# Patient Record
Sex: Female | Born: 1935 | Race: White | Hispanic: No | Smoking: Never smoker
Health system: Southern US, Community
[De-identification: ages and names within clinical notes are randomized; demographics above are authoritative.]

## PROBLEM LIST (undated history)

## (undated) DIAGNOSIS — D649 Anemia, unspecified: Secondary | ICD-10-CM

## (undated) DIAGNOSIS — C55 Malignant neoplasm of uterus, part unspecified: Secondary | ICD-10-CM

## (undated) DIAGNOSIS — H409 Unspecified glaucoma: Secondary | ICD-10-CM

## (undated) DIAGNOSIS — K589 Irritable bowel syndrome without diarrhea: Secondary | ICD-10-CM

## (undated) DIAGNOSIS — D126 Benign neoplasm of colon, unspecified: Secondary | ICD-10-CM

## (undated) DIAGNOSIS — I10 Essential (primary) hypertension: Secondary | ICD-10-CM

## (undated) DIAGNOSIS — C541 Malignant neoplasm of endometrium: Secondary | ICD-10-CM

## (undated) DIAGNOSIS — E785 Hyperlipidemia, unspecified: Secondary | ICD-10-CM

## (undated) DIAGNOSIS — E119 Type 2 diabetes mellitus without complications: Secondary | ICD-10-CM

## (undated) DIAGNOSIS — K5790 Diverticulosis of intestine, part unspecified, without perforation or abscess without bleeding: Secondary | ICD-10-CM

## (undated) DIAGNOSIS — M1990 Unspecified osteoarthritis, unspecified site: Secondary | ICD-10-CM

## (undated) HISTORY — DX: Benign neoplasm of colon, unspecified: D12.6

## (undated) HISTORY — DX: Irritable bowel syndrome, unspecified: K58.9

## (undated) HISTORY — PX: TONSILLECTOMY: SUR1361

## (undated) HISTORY — DX: Hyperlipidemia, unspecified: E78.5

## (undated) HISTORY — DX: Anemia, unspecified: D64.9

## (undated) HISTORY — DX: Unspecified osteoarthritis, unspecified site: M19.90

## (undated) HISTORY — DX: Unspecified glaucoma: H40.9

## (undated) HISTORY — DX: Diverticulosis of intestine, part unspecified, without perforation or abscess without bleeding: K57.90

## (undated) HISTORY — DX: Malignant neoplasm of uterus, part unspecified: C55

## (undated) HISTORY — DX: Essential (primary) hypertension: I10

## (undated) HISTORY — DX: Type 2 diabetes mellitus without complications: E11.9

## (undated) HISTORY — DX: Malignant neoplasm of endometrium: C54.1

---

## 1978-12-06 HISTORY — PX: TUMOR REMOVAL: SHX12

## 2008-12-06 HISTORY — PX: ABDOMINAL HYSTERECTOMY: SHX81

## 2009-05-19 DIAGNOSIS — C541 Malignant neoplasm of endometrium: Secondary | ICD-10-CM

## 2009-05-19 HISTORY — DX: Malignant neoplasm of endometrium: C54.1

## 2011-12-14 DIAGNOSIS — M47817 Spondylosis without myelopathy or radiculopathy, lumbosacral region: Secondary | ICD-10-CM | POA: Diagnosis not present

## 2012-01-11 DIAGNOSIS — M5137 Other intervertebral disc degeneration, lumbosacral region: Secondary | ICD-10-CM | POA: Diagnosis not present

## 2012-01-11 DIAGNOSIS — M47817 Spondylosis without myelopathy or radiculopathy, lumbosacral region: Secondary | ICD-10-CM | POA: Diagnosis not present

## 2012-01-11 DIAGNOSIS — IMO0002 Reserved for concepts with insufficient information to code with codable children: Secondary | ICD-10-CM | POA: Diagnosis not present

## 2012-02-08 DIAGNOSIS — M5137 Other intervertebral disc degeneration, lumbosacral region: Secondary | ICD-10-CM | POA: Diagnosis not present

## 2012-02-08 DIAGNOSIS — M47817 Spondylosis without myelopathy or radiculopathy, lumbosacral region: Secondary | ICD-10-CM | POA: Diagnosis not present

## 2012-02-08 DIAGNOSIS — IMO0002 Reserved for concepts with insufficient information to code with codable children: Secondary | ICD-10-CM | POA: Diagnosis not present

## 2012-03-22 DIAGNOSIS — R0789 Other chest pain: Secondary | ICD-10-CM | POA: Diagnosis not present

## 2012-03-22 DIAGNOSIS — R079 Chest pain, unspecified: Secondary | ICD-10-CM | POA: Diagnosis not present

## 2012-03-22 DIAGNOSIS — K219 Gastro-esophageal reflux disease without esophagitis: Secondary | ICD-10-CM | POA: Diagnosis not present

## 2012-03-22 DIAGNOSIS — I10 Essential (primary) hypertension: Secondary | ICD-10-CM | POA: Diagnosis not present

## 2012-03-22 DIAGNOSIS — K279 Peptic ulcer, site unspecified, unspecified as acute or chronic, without hemorrhage or perforation: Secondary | ICD-10-CM | POA: Diagnosis not present

## 2012-03-22 DIAGNOSIS — E7800 Pure hypercholesterolemia, unspecified: Secondary | ICD-10-CM | POA: Diagnosis not present

## 2012-03-22 DIAGNOSIS — E785 Hyperlipidemia, unspecified: Secondary | ICD-10-CM | POA: Diagnosis not present

## 2012-03-22 DIAGNOSIS — Z90710 Acquired absence of both cervix and uterus: Secondary | ICD-10-CM | POA: Diagnosis not present

## 2012-03-22 DIAGNOSIS — R1013 Epigastric pain: Secondary | ICD-10-CM | POA: Diagnosis not present

## 2012-03-22 DIAGNOSIS — Z794 Long term (current) use of insulin: Secondary | ICD-10-CM | POA: Diagnosis not present

## 2012-03-22 DIAGNOSIS — E119 Type 2 diabetes mellitus without complications: Secondary | ICD-10-CM | POA: Diagnosis not present

## 2012-03-22 DIAGNOSIS — R110 Nausea: Secondary | ICD-10-CM | POA: Diagnosis not present

## 2012-03-22 DIAGNOSIS — R109 Unspecified abdominal pain: Secondary | ICD-10-CM | POA: Diagnosis not present

## 2012-03-27 DIAGNOSIS — M5137 Other intervertebral disc degeneration, lumbosacral region: Secondary | ICD-10-CM | POA: Diagnosis not present

## 2012-03-27 DIAGNOSIS — IMO0002 Reserved for concepts with insufficient information to code with codable children: Secondary | ICD-10-CM | POA: Diagnosis not present

## 2012-03-27 DIAGNOSIS — M999 Biomechanical lesion, unspecified: Secondary | ICD-10-CM | POA: Diagnosis not present

## 2012-03-28 DIAGNOSIS — M999 Biomechanical lesion, unspecified: Secondary | ICD-10-CM | POA: Diagnosis not present

## 2012-03-28 DIAGNOSIS — M5137 Other intervertebral disc degeneration, lumbosacral region: Secondary | ICD-10-CM | POA: Diagnosis not present

## 2012-03-28 DIAGNOSIS — IMO0002 Reserved for concepts with insufficient information to code with codable children: Secondary | ICD-10-CM | POA: Diagnosis not present

## 2012-03-30 DIAGNOSIS — IMO0002 Reserved for concepts with insufficient information to code with codable children: Secondary | ICD-10-CM | POA: Diagnosis not present

## 2012-03-30 DIAGNOSIS — M5137 Other intervertebral disc degeneration, lumbosacral region: Secondary | ICD-10-CM | POA: Diagnosis not present

## 2012-03-30 DIAGNOSIS — M999 Biomechanical lesion, unspecified: Secondary | ICD-10-CM | POA: Diagnosis not present

## 2012-04-03 DIAGNOSIS — M999 Biomechanical lesion, unspecified: Secondary | ICD-10-CM | POA: Diagnosis not present

## 2012-04-03 DIAGNOSIS — IMO0002 Reserved for concepts with insufficient information to code with codable children: Secondary | ICD-10-CM | POA: Diagnosis not present

## 2012-04-03 DIAGNOSIS — M5137 Other intervertebral disc degeneration, lumbosacral region: Secondary | ICD-10-CM | POA: Diagnosis not present

## 2012-04-04 DIAGNOSIS — M5137 Other intervertebral disc degeneration, lumbosacral region: Secondary | ICD-10-CM | POA: Diagnosis not present

## 2012-04-04 DIAGNOSIS — IMO0002 Reserved for concepts with insufficient information to code with codable children: Secondary | ICD-10-CM | POA: Diagnosis not present

## 2012-04-04 DIAGNOSIS — M999 Biomechanical lesion, unspecified: Secondary | ICD-10-CM | POA: Diagnosis not present

## 2012-04-06 DIAGNOSIS — IMO0002 Reserved for concepts with insufficient information to code with codable children: Secondary | ICD-10-CM | POA: Diagnosis not present

## 2012-04-06 DIAGNOSIS — M5137 Other intervertebral disc degeneration, lumbosacral region: Secondary | ICD-10-CM | POA: Diagnosis not present

## 2012-04-06 DIAGNOSIS — M999 Biomechanical lesion, unspecified: Secondary | ICD-10-CM | POA: Diagnosis not present

## 2012-04-10 DIAGNOSIS — M999 Biomechanical lesion, unspecified: Secondary | ICD-10-CM | POA: Diagnosis not present

## 2012-04-10 DIAGNOSIS — IMO0002 Reserved for concepts with insufficient information to code with codable children: Secondary | ICD-10-CM | POA: Diagnosis not present

## 2012-04-10 DIAGNOSIS — M5137 Other intervertebral disc degeneration, lumbosacral region: Secondary | ICD-10-CM | POA: Diagnosis not present

## 2012-04-11 DIAGNOSIS — M999 Biomechanical lesion, unspecified: Secondary | ICD-10-CM | POA: Diagnosis not present

## 2012-04-11 DIAGNOSIS — IMO0002 Reserved for concepts with insufficient information to code with codable children: Secondary | ICD-10-CM | POA: Diagnosis not present

## 2012-04-11 DIAGNOSIS — M5137 Other intervertebral disc degeneration, lumbosacral region: Secondary | ICD-10-CM | POA: Diagnosis not present

## 2012-04-12 DIAGNOSIS — M999 Biomechanical lesion, unspecified: Secondary | ICD-10-CM | POA: Diagnosis not present

## 2012-04-12 DIAGNOSIS — IMO0002 Reserved for concepts with insufficient information to code with codable children: Secondary | ICD-10-CM | POA: Diagnosis not present

## 2012-04-12 DIAGNOSIS — M5137 Other intervertebral disc degeneration, lumbosacral region: Secondary | ICD-10-CM | POA: Diagnosis not present

## 2012-04-25 DIAGNOSIS — M999 Biomechanical lesion, unspecified: Secondary | ICD-10-CM | POA: Diagnosis not present

## 2012-04-25 DIAGNOSIS — IMO0002 Reserved for concepts with insufficient information to code with codable children: Secondary | ICD-10-CM | POA: Diagnosis not present

## 2012-04-25 DIAGNOSIS — M5137 Other intervertebral disc degeneration, lumbosacral region: Secondary | ICD-10-CM | POA: Diagnosis not present

## 2012-04-27 DIAGNOSIS — IMO0002 Reserved for concepts with insufficient information to code with codable children: Secondary | ICD-10-CM | POA: Diagnosis not present

## 2012-04-27 DIAGNOSIS — M5137 Other intervertebral disc degeneration, lumbosacral region: Secondary | ICD-10-CM | POA: Diagnosis not present

## 2012-04-27 DIAGNOSIS — M999 Biomechanical lesion, unspecified: Secondary | ICD-10-CM | POA: Diagnosis not present

## 2012-05-02 DIAGNOSIS — M5137 Other intervertebral disc degeneration, lumbosacral region: Secondary | ICD-10-CM | POA: Diagnosis not present

## 2012-05-02 DIAGNOSIS — IMO0002 Reserved for concepts with insufficient information to code with codable children: Secondary | ICD-10-CM | POA: Diagnosis not present

## 2012-05-02 DIAGNOSIS — M999 Biomechanical lesion, unspecified: Secondary | ICD-10-CM | POA: Diagnosis not present

## 2012-05-03 DIAGNOSIS — IMO0002 Reserved for concepts with insufficient information to code with codable children: Secondary | ICD-10-CM | POA: Diagnosis not present

## 2012-05-03 DIAGNOSIS — M5137 Other intervertebral disc degeneration, lumbosacral region: Secondary | ICD-10-CM | POA: Diagnosis not present

## 2012-05-03 DIAGNOSIS — M999 Biomechanical lesion, unspecified: Secondary | ICD-10-CM | POA: Diagnosis not present

## 2012-05-04 DIAGNOSIS — M999 Biomechanical lesion, unspecified: Secondary | ICD-10-CM | POA: Diagnosis not present

## 2012-05-04 DIAGNOSIS — M5137 Other intervertebral disc degeneration, lumbosacral region: Secondary | ICD-10-CM | POA: Diagnosis not present

## 2012-05-04 DIAGNOSIS — IMO0002 Reserved for concepts with insufficient information to code with codable children: Secondary | ICD-10-CM | POA: Diagnosis not present

## 2012-05-08 DIAGNOSIS — M999 Biomechanical lesion, unspecified: Secondary | ICD-10-CM | POA: Diagnosis not present

## 2012-05-08 DIAGNOSIS — IMO0002 Reserved for concepts with insufficient information to code with codable children: Secondary | ICD-10-CM | POA: Diagnosis not present

## 2012-05-08 DIAGNOSIS — M5137 Other intervertebral disc degeneration, lumbosacral region: Secondary | ICD-10-CM | POA: Diagnosis not present

## 2012-05-10 DIAGNOSIS — M5137 Other intervertebral disc degeneration, lumbosacral region: Secondary | ICD-10-CM | POA: Diagnosis not present

## 2012-05-10 DIAGNOSIS — M999 Biomechanical lesion, unspecified: Secondary | ICD-10-CM | POA: Diagnosis not present

## 2012-05-10 DIAGNOSIS — IMO0002 Reserved for concepts with insufficient information to code with codable children: Secondary | ICD-10-CM | POA: Diagnosis not present

## 2012-05-15 DIAGNOSIS — K5900 Constipation, unspecified: Secondary | ICD-10-CM | POA: Diagnosis not present

## 2012-05-15 DIAGNOSIS — K219 Gastro-esophageal reflux disease without esophagitis: Secondary | ICD-10-CM | POA: Diagnosis not present

## 2012-05-15 DIAGNOSIS — R079 Chest pain, unspecified: Secondary | ICD-10-CM | POA: Diagnosis not present

## 2012-05-16 DIAGNOSIS — M999 Biomechanical lesion, unspecified: Secondary | ICD-10-CM | POA: Diagnosis not present

## 2012-05-16 DIAGNOSIS — M5137 Other intervertebral disc degeneration, lumbosacral region: Secondary | ICD-10-CM | POA: Diagnosis not present

## 2012-05-16 DIAGNOSIS — IMO0002 Reserved for concepts with insufficient information to code with codable children: Secondary | ICD-10-CM | POA: Diagnosis not present

## 2012-05-18 DIAGNOSIS — R079 Chest pain, unspecified: Secondary | ICD-10-CM | POA: Diagnosis not present

## 2012-05-18 DIAGNOSIS — K2970 Gastritis, unspecified, without bleeding: Secondary | ICD-10-CM | POA: Diagnosis not present

## 2012-05-18 DIAGNOSIS — K259 Gastric ulcer, unspecified as acute or chronic, without hemorrhage or perforation: Secondary | ICD-10-CM | POA: Diagnosis not present

## 2012-05-18 DIAGNOSIS — R1013 Epigastric pain: Secondary | ICD-10-CM | POA: Diagnosis not present

## 2012-05-25 DIAGNOSIS — IMO0002 Reserved for concepts with insufficient information to code with codable children: Secondary | ICD-10-CM | POA: Diagnosis not present

## 2012-05-25 DIAGNOSIS — M5137 Other intervertebral disc degeneration, lumbosacral region: Secondary | ICD-10-CM | POA: Diagnosis not present

## 2012-05-25 DIAGNOSIS — M999 Biomechanical lesion, unspecified: Secondary | ICD-10-CM | POA: Diagnosis not present

## 2012-05-30 DIAGNOSIS — M5137 Other intervertebral disc degeneration, lumbosacral region: Secondary | ICD-10-CM | POA: Diagnosis not present

## 2012-05-30 DIAGNOSIS — M999 Biomechanical lesion, unspecified: Secondary | ICD-10-CM | POA: Diagnosis not present

## 2012-05-30 DIAGNOSIS — IMO0002 Reserved for concepts with insufficient information to code with codable children: Secondary | ICD-10-CM | POA: Diagnosis not present

## 2012-06-05 DIAGNOSIS — IMO0002 Reserved for concepts with insufficient information to code with codable children: Secondary | ICD-10-CM | POA: Diagnosis not present

## 2012-06-05 DIAGNOSIS — E782 Mixed hyperlipidemia: Secondary | ICD-10-CM | POA: Diagnosis not present

## 2012-06-05 DIAGNOSIS — I10 Essential (primary) hypertension: Secondary | ICD-10-CM | POA: Diagnosis not present

## 2012-06-05 DIAGNOSIS — E118 Type 2 diabetes mellitus with unspecified complications: Secondary | ICD-10-CM | POA: Diagnosis not present

## 2012-06-05 DIAGNOSIS — R609 Edema, unspecified: Secondary | ICD-10-CM | POA: Diagnosis not present

## 2012-06-06 DIAGNOSIS — I10 Essential (primary) hypertension: Secondary | ICD-10-CM | POA: Diagnosis not present

## 2012-06-06 DIAGNOSIS — G909 Disorder of the autonomic nervous system, unspecified: Secondary | ICD-10-CM | POA: Diagnosis not present

## 2012-06-06 DIAGNOSIS — E782 Mixed hyperlipidemia: Secondary | ICD-10-CM | POA: Diagnosis not present

## 2012-06-06 DIAGNOSIS — E1149 Type 2 diabetes mellitus with other diabetic neurological complication: Secondary | ICD-10-CM | POA: Diagnosis not present

## 2012-06-09 DIAGNOSIS — Z1231 Encounter for screening mammogram for malignant neoplasm of breast: Secondary | ICD-10-CM | POA: Diagnosis not present

## 2012-06-13 DIAGNOSIS — M999 Biomechanical lesion, unspecified: Secondary | ICD-10-CM | POA: Diagnosis not present

## 2012-06-13 DIAGNOSIS — IMO0002 Reserved for concepts with insufficient information to code with codable children: Secondary | ICD-10-CM | POA: Diagnosis not present

## 2012-06-13 DIAGNOSIS — M5137 Other intervertebral disc degeneration, lumbosacral region: Secondary | ICD-10-CM | POA: Diagnosis not present

## 2012-06-20 DIAGNOSIS — IMO0002 Reserved for concepts with insufficient information to code with codable children: Secondary | ICD-10-CM | POA: Diagnosis not present

## 2012-06-20 DIAGNOSIS — M999 Biomechanical lesion, unspecified: Secondary | ICD-10-CM | POA: Diagnosis not present

## 2012-06-20 DIAGNOSIS — M5137 Other intervertebral disc degeneration, lumbosacral region: Secondary | ICD-10-CM | POA: Diagnosis not present

## 2012-12-04 DIAGNOSIS — I10 Essential (primary) hypertension: Secondary | ICD-10-CM | POA: Diagnosis not present

## 2012-12-04 DIAGNOSIS — E782 Mixed hyperlipidemia: Secondary | ICD-10-CM | POA: Diagnosis not present

## 2012-12-04 DIAGNOSIS — E1165 Type 2 diabetes mellitus with hyperglycemia: Secondary | ICD-10-CM | POA: Diagnosis not present

## 2012-12-04 DIAGNOSIS — E118 Type 2 diabetes mellitus with unspecified complications: Secondary | ICD-10-CM | POA: Diagnosis not present

## 2012-12-05 DIAGNOSIS — E1149 Type 2 diabetes mellitus with other diabetic neurological complication: Secondary | ICD-10-CM | POA: Diagnosis not present

## 2012-12-05 DIAGNOSIS — E782 Mixed hyperlipidemia: Secondary | ICD-10-CM | POA: Diagnosis not present

## 2012-12-05 DIAGNOSIS — I10 Essential (primary) hypertension: Secondary | ICD-10-CM | POA: Diagnosis not present

## 2012-12-05 DIAGNOSIS — G909 Disorder of the autonomic nervous system, unspecified: Secondary | ICD-10-CM | POA: Diagnosis not present

## 2012-12-06 DIAGNOSIS — D126 Benign neoplasm of colon, unspecified: Secondary | ICD-10-CM

## 2012-12-06 HISTORY — DX: Benign neoplasm of colon, unspecified: D12.6

## 2012-12-18 DIAGNOSIS — I10 Essential (primary) hypertension: Secondary | ICD-10-CM | POA: Diagnosis not present

## 2012-12-18 DIAGNOSIS — F341 Dysthymic disorder: Secondary | ICD-10-CM | POA: Diagnosis not present

## 2012-12-18 DIAGNOSIS — R300 Dysuria: Secondary | ICD-10-CM | POA: Diagnosis not present

## 2012-12-18 DIAGNOSIS — M545 Low back pain: Secondary | ICD-10-CM | POA: Diagnosis not present

## 2013-01-09 DIAGNOSIS — M5137 Other intervertebral disc degeneration, lumbosacral region: Secondary | ICD-10-CM | POA: Diagnosis not present

## 2013-01-09 DIAGNOSIS — IMO0002 Reserved for concepts with insufficient information to code with codable children: Secondary | ICD-10-CM | POA: Diagnosis not present

## 2013-01-09 DIAGNOSIS — M999 Biomechanical lesion, unspecified: Secondary | ICD-10-CM | POA: Diagnosis not present

## 2013-01-11 DIAGNOSIS — M5137 Other intervertebral disc degeneration, lumbosacral region: Secondary | ICD-10-CM | POA: Diagnosis not present

## 2013-01-11 DIAGNOSIS — M999 Biomechanical lesion, unspecified: Secondary | ICD-10-CM | POA: Diagnosis not present

## 2013-01-11 DIAGNOSIS — IMO0002 Reserved for concepts with insufficient information to code with codable children: Secondary | ICD-10-CM | POA: Diagnosis not present

## 2013-01-15 DIAGNOSIS — R300 Dysuria: Secondary | ICD-10-CM | POA: Diagnosis not present

## 2013-01-15 DIAGNOSIS — R82998 Other abnormal findings in urine: Secondary | ICD-10-CM | POA: Diagnosis not present

## 2013-01-15 DIAGNOSIS — M545 Low back pain: Secondary | ICD-10-CM | POA: Diagnosis not present

## 2013-01-16 DIAGNOSIS — M999 Biomechanical lesion, unspecified: Secondary | ICD-10-CM | POA: Diagnosis not present

## 2013-01-16 DIAGNOSIS — M5137 Other intervertebral disc degeneration, lumbosacral region: Secondary | ICD-10-CM | POA: Diagnosis not present

## 2013-01-16 DIAGNOSIS — IMO0002 Reserved for concepts with insufficient information to code with codable children: Secondary | ICD-10-CM | POA: Diagnosis not present

## 2013-01-22 DIAGNOSIS — IMO0002 Reserved for concepts with insufficient information to code with codable children: Secondary | ICD-10-CM | POA: Diagnosis not present

## 2013-01-22 DIAGNOSIS — M999 Biomechanical lesion, unspecified: Secondary | ICD-10-CM | POA: Diagnosis not present

## 2013-01-22 DIAGNOSIS — M5137 Other intervertebral disc degeneration, lumbosacral region: Secondary | ICD-10-CM | POA: Diagnosis not present

## 2013-02-08 DIAGNOSIS — IMO0002 Reserved for concepts with insufficient information to code with codable children: Secondary | ICD-10-CM | POA: Diagnosis not present

## 2013-03-01 DIAGNOSIS — M5137 Other intervertebral disc degeneration, lumbosacral region: Secondary | ICD-10-CM | POA: Diagnosis not present

## 2013-03-01 DIAGNOSIS — IMO0002 Reserved for concepts with insufficient information to code with codable children: Secondary | ICD-10-CM | POA: Diagnosis not present

## 2013-03-01 DIAGNOSIS — M999 Biomechanical lesion, unspecified: Secondary | ICD-10-CM | POA: Diagnosis not present

## 2013-03-01 DIAGNOSIS — IMO0001 Reserved for inherently not codable concepts without codable children: Secondary | ICD-10-CM | POA: Diagnosis not present

## 2013-03-01 DIAGNOSIS — M62838 Other muscle spasm: Secondary | ICD-10-CM | POA: Diagnosis not present

## 2013-03-06 DIAGNOSIS — M5137 Other intervertebral disc degeneration, lumbosacral region: Secondary | ICD-10-CM | POA: Diagnosis not present

## 2013-03-06 DIAGNOSIS — IMO0002 Reserved for concepts with insufficient information to code with codable children: Secondary | ICD-10-CM | POA: Diagnosis not present

## 2013-03-06 DIAGNOSIS — M999 Biomechanical lesion, unspecified: Secondary | ICD-10-CM | POA: Diagnosis not present

## 2013-03-06 DIAGNOSIS — M62838 Other muscle spasm: Secondary | ICD-10-CM | POA: Diagnosis not present

## 2013-03-06 DIAGNOSIS — IMO0001 Reserved for inherently not codable concepts without codable children: Secondary | ICD-10-CM | POA: Diagnosis not present

## 2013-03-08 DIAGNOSIS — H40019 Open angle with borderline findings, low risk, unspecified eye: Secondary | ICD-10-CM | POA: Diagnosis not present

## 2013-03-08 DIAGNOSIS — H5200 Hypermetropia, unspecified eye: Secondary | ICD-10-CM | POA: Diagnosis not present

## 2013-03-15 DIAGNOSIS — H40019 Open angle with borderline findings, low risk, unspecified eye: Secondary | ICD-10-CM | POA: Diagnosis not present

## 2013-03-26 DIAGNOSIS — H40019 Open angle with borderline findings, low risk, unspecified eye: Secondary | ICD-10-CM | POA: Diagnosis not present

## 2013-03-27 DIAGNOSIS — IMO0001 Reserved for inherently not codable concepts without codable children: Secondary | ICD-10-CM | POA: Diagnosis not present

## 2013-03-27 DIAGNOSIS — IMO0002 Reserved for concepts with insufficient information to code with codable children: Secondary | ICD-10-CM | POA: Diagnosis not present

## 2013-03-27 DIAGNOSIS — M999 Biomechanical lesion, unspecified: Secondary | ICD-10-CM | POA: Diagnosis not present

## 2013-03-27 DIAGNOSIS — M5137 Other intervertebral disc degeneration, lumbosacral region: Secondary | ICD-10-CM | POA: Diagnosis not present

## 2013-03-27 DIAGNOSIS — M62838 Other muscle spasm: Secondary | ICD-10-CM | POA: Diagnosis not present

## 2013-04-20 ENCOUNTER — Telehealth: Payer: Self-pay | Admitting: Family Medicine

## 2013-04-20 ENCOUNTER — Ambulatory Visit (INDEPENDENT_AMBULATORY_CARE_PROVIDER_SITE_OTHER): Payer: Medicare Other | Admitting: Family Medicine

## 2013-04-20 ENCOUNTER — Encounter: Payer: Self-pay | Admitting: Family Medicine

## 2013-04-20 VITALS — BP 140/66 | HR 57 | Temp 98.2°F | Wt 155.0 lb

## 2013-04-20 DIAGNOSIS — I10 Essential (primary) hypertension: Secondary | ICD-10-CM | POA: Diagnosis not present

## 2013-04-20 DIAGNOSIS — R350 Frequency of micturition: Secondary | ICD-10-CM

## 2013-04-20 DIAGNOSIS — E1165 Type 2 diabetes mellitus with hyperglycemia: Secondary | ICD-10-CM | POA: Diagnosis not present

## 2013-04-20 DIAGNOSIS — N390 Urinary tract infection, site not specified: Secondary | ICD-10-CM | POA: Diagnosis not present

## 2013-04-20 DIAGNOSIS — E785 Hyperlipidemia, unspecified: Secondary | ICD-10-CM | POA: Insufficient documentation

## 2013-04-20 DIAGNOSIS — E118 Type 2 diabetes mellitus with unspecified complications: Secondary | ICD-10-CM

## 2013-04-20 MED ORDER — CIPROFLOXACIN HCL 500 MG PO TABS: 500.0000 mg | ORAL_TABLET | Freq: Two times a day (BID) | ORAL | Status: AC

## 2013-04-20 NOTE — Telephone Encounter (Signed)
Med list and name change updated.

## 2013-04-20 NOTE — Telephone Encounter (Signed)
Please correct name: as listed on Medicare card: Janice Norman (not just [place]) , call follows office visit 04/20/13, verify dosage of HCTZ: 50  Mg once daily; Actos (generic) 45 mg once daily; Pantoprazole: 40 mg daily; Simvistatin 20 mg daily; Metformin (generic) 500 mg HS; Enalapril 20 mg daily.

## 2013-04-20 NOTE — Progress Notes (Signed)
  Subjective:    Janice Norman is a 79 y.o. female who complains of frequency and suprapubic pressure. She has had symptoms for 2 weeks. Patient also complains of stomach ache. Patient denies back pain, congestion, cough, fever, headache, rhinitis, sorethroat and vaginal discharge. Patient does not have a history of recurrent UTI. Patient does not have a history of pyelonephritis.   The following portions of the patient's history were reviewed and updated as appropriate: allergies, current medications, past family history, past medical history, past social history, past surgical history and problem list.  Review of Systems Pertinent items are noted in HPI.    Objective:    BP 140/66  Pulse 57  Temp(Src) 98.2 F (36.8 C) (Oral)  Wt 155 lb (70.308 kg)  SpO2 97% General appearance: alert, cooperative, appears stated age and no distress Abdomen: soft, non-tender; bowel sounds normal; no masses,  no organomegaly  Laboratory:  Urine dipstick: pt took AZO.   Micro exam: not done.    Assessment:    dysuria     Plan:    Medications: ciprofloxacin. Maintain adequate hydration. Follow up if symptoms not improving, and as needed.  Culture pending rto to establish

## 2013-04-20 NOTE — Patient Instructions (Addendum)
Urinary Tract Infection Urinary tract infections (UTIs) can develop anywhere along your urinary tract. Your urinary tract is your body's drainage system for removing wastes and extra water. Your urinary tract includes two kidneys, two ureters, a bladder, and a urethra. Your kidneys are a pair of bean-shaped organs. Each kidney is about the size of your fist. They are located below your ribs, one on each side of your spine. CAUSES Infections are caused by microbes, which are microscopic organisms, including fungi, viruses, and bacteria. These organisms are so small that they can only be seen through a microscope. Bacteria are the microbes that most commonly cause UTIs. SYMPTOMS  Symptoms of UTIs may vary by age and gender of the patient and by the location of the infection. Symptoms in young women typically include a frequent and intense urge to urinate and a painful, burning feeling in the bladder or urethra during urination. Older women and men are more likely to be tired, shaky, and weak and have muscle aches and abdominal pain. A fever may mean the infection is in your kidneys. Other symptoms of a kidney infection include pain in your back or sides below the ribs, nausea, and vomiting. DIAGNOSIS To diagnose a UTI, your caregiver will ask you about your symptoms. Your caregiver also will ask to provide a urine sample. The urine sample will be tested for bacteria and white blood cells. White blood cells are made by your body to help fight infection. TREATMENT  Typically, UTIs can be treated with medication. Because most UTIs are caused by a bacterial infection, they usually can be treated with the use of antibiotics. The choice of antibiotic and length of treatment depend on your symptoms and the type of bacteria causing your infection. HOME CARE INSTRUCTIONS  If you were prescribed antibiotics, take them exactly as your caregiver instructs you. Finish the medication even if you feel better after you  have only taken some of the medication.  Drink enough water and fluids to keep your urine clear or pale yellow.  Avoid caffeine, tea, and carbonated beverages. They tend to irritate your bladder.  Empty your bladder often. Avoid holding urine for long periods of time.  Empty your bladder before and after sexual intercourse.  After a bowel movement, women should cleanse from front to back. Use each tissue only once. SEEK MEDICAL CARE IF:   You have back pain.  You develop a fever.  Your symptoms do not begin to resolve within 3 days. SEEK IMMEDIATE MEDICAL CARE IF:   You have severe back pain or lower abdominal pain.  You develop chills.  You have nausea or vomiting.  You have continued burning or discomfort with urination. MAKE SURE YOU:   Understand these instructions.  Will watch your condition.  Will get help right away if you are not doing well or get worse. Document Released: 09/01/2005 Document Revised: 05/23/2012 Document Reviewed: 12/31/2011 ExitCare Patient Information 2013 [employer].  

## 2013-04-23 DIAGNOSIS — H4011X0 Primary open-angle glaucoma, stage unspecified: Secondary | ICD-10-CM | POA: Diagnosis not present

## 2013-04-23 LAB — URINE CULTURE: Colony Count: >=100000

## 2013-05-15 DIAGNOSIS — H25019 Cortical age-related cataract, unspecified eye: Secondary | ICD-10-CM | POA: Diagnosis not present

## 2013-05-15 DIAGNOSIS — H2510 Age-related nuclear cataract, unspecified eye: Secondary | ICD-10-CM | POA: Diagnosis not present

## 2013-05-15 DIAGNOSIS — H25049 Posterior subcapsular polar age-related cataract, unspecified eye: Secondary | ICD-10-CM | POA: Diagnosis not present

## 2013-05-15 DIAGNOSIS — H4010X0 Unspecified open-angle glaucoma, stage unspecified: Secondary | ICD-10-CM | POA: Diagnosis not present

## 2013-05-15 DIAGNOSIS — H3530 Unspecified macular degeneration: Secondary | ICD-10-CM | POA: Diagnosis not present

## 2013-05-15 DIAGNOSIS — E119 Type 2 diabetes mellitus without complications: Secondary | ICD-10-CM | POA: Diagnosis not present

## 2013-05-21 DIAGNOSIS — H4011X0 Primary open-angle glaucoma, stage unspecified: Secondary | ICD-10-CM | POA: Diagnosis not present

## 2013-05-30 DIAGNOSIS — Z124 Encounter for screening for malignant neoplasm of cervix: Secondary | ICD-10-CM | POA: Diagnosis not present

## 2013-05-30 DIAGNOSIS — C549 Malignant neoplasm of corpus uteri, unspecified: Secondary | ICD-10-CM | POA: Diagnosis not present

## 2013-06-04 ENCOUNTER — Ambulatory Visit (INDEPENDENT_AMBULATORY_CARE_PROVIDER_SITE_OTHER): Payer: Medicare Other | Admitting: Family Medicine

## 2013-06-04 ENCOUNTER — Encounter: Payer: Self-pay | Admitting: Family Medicine

## 2013-06-04 VITALS — BP 126/60 | HR 68 | Temp 97.4°F | Ht 62.0 in | Wt 159.4 lb

## 2013-06-04 DIAGNOSIS — IMO0002 Reserved for concepts with insufficient information to code with codable children: Secondary | ICD-10-CM

## 2013-06-04 DIAGNOSIS — E118 Type 2 diabetes mellitus with unspecified complications: Secondary | ICD-10-CM

## 2013-06-04 DIAGNOSIS — E1059 Type 1 diabetes mellitus with other circulatory complications: Secondary | ICD-10-CM

## 2013-06-04 DIAGNOSIS — I10 Essential (primary) hypertension: Secondary | ICD-10-CM

## 2013-06-04 DIAGNOSIS — E785 Hyperlipidemia, unspecified: Secondary | ICD-10-CM

## 2013-06-04 DIAGNOSIS — E1165 Type 2 diabetes mellitus with hyperglycemia: Secondary | ICD-10-CM

## 2013-06-04 DIAGNOSIS — M549 Dorsalgia, unspecified: Secondary | ICD-10-CM

## 2013-06-04 DIAGNOSIS — G4700 Insomnia, unspecified: Secondary | ICD-10-CM

## 2013-06-04 DIAGNOSIS — N390 Urinary tract infection, site not specified: Secondary | ICD-10-CM

## 2013-06-04 DIAGNOSIS — K219 Gastro-esophageal reflux disease without esophagitis: Secondary | ICD-10-CM

## 2013-06-04 DIAGNOSIS — Z23 Encounter for immunization: Secondary | ICD-10-CM

## 2013-06-04 LAB — LIPID PANEL
Cholesterol: 168 mg/dL (ref 0–200)
LDL Cholesterol: 100 mg/dL — ABNORMAL HIGH (ref 0–99)
Total CHOL/HDL Ratio: 4
Triglycerides: 118 mg/dL (ref 0.0–149.0)
VLDL: 23.6 mg/dL (ref 0.0–40.0)

## 2013-06-04 LAB — BASIC METABOLIC PANEL
CO2: 31 mEq/L (ref 19–32)
Calcium: 10 mg/dL (ref 8.4–10.5)
Chloride: 101 mEq/L (ref 96–112)
Creatinine, Ser: 0.8 mg/dL (ref 0.4–1.2)
Sodium: 136 mEq/L (ref 135–145)

## 2013-06-04 LAB — POCT URINALYSIS DIPSTICK
Blood, UA: NEGATIVE
Glucose, UA: NEGATIVE
Nitrite, UA: NEGATIVE
Protein, UA: NEGATIVE
Spec Grav, UA: 1.015
Urobilinogen, UA: 0.2

## 2013-06-04 LAB — HEPATIC FUNCTION PANEL
ALT: 17 U/L (ref 0–35)
AST: 23 U/L (ref 0–37)
Bilirubin, Direct: 0 mg/dL (ref 0.0–0.3)
Total Bilirubin: 0.5 mg/dL (ref 0.3–1.2)

## 2013-06-04 LAB — HEMOGLOBIN A1C: Hgb A1c MFr Bld: 7 % — ABNORMAL HIGH (ref 4.6–6.5)

## 2013-06-04 MED ORDER — PNEUMOCOCCAL VAC POLYVALENT 25 MCG/0.5ML IJ INJ: 0.5000 mL | INJECTION | INTRAMUSCULAR | Status: DC

## 2013-06-04 MED ORDER — METFORMIN HCL ER 500 MG PO TB24: ORAL_TABLET | ORAL | Status: DC

## 2013-06-04 MED ORDER — ZOSTER VACCINE LIVE 19400 UNT/0.65ML ~~LOC~~ SOLR: 0.6500 mL | Freq: Once | SUBCUTANEOUS | Status: DC

## 2013-06-04 MED ORDER — PANTOPRAZOLE SODIUM 40 MG PO TBEC: 40.0000 mg | DELAYED_RELEASE_TABLET | Freq: Every day | ORAL | Status: DC

## 2013-06-04 MED ORDER — HYDROCHLOROTHIAZIDE 50 MG PO TABS: 50.0000 mg | ORAL_TABLET | Freq: Every day | ORAL | Status: DC

## 2013-06-04 MED ORDER — TRAZODONE HCL 50 MG PO TABS: 100.0000 mg | ORAL_TABLET | Freq: Every day | ORAL | Status: DC

## 2013-06-04 NOTE — Patient Instructions (Addendum)
Diabetes and Standards of Medical Care  Diabetes is complicated. You may find that your diabetes team includes a dietitian, nurse, diabetes educator, eye doctor, and more. To help everyone know what is going on and to help you get the care you deserve, the following schedule of care was developed to help keep you on track. Below are the tests, exams, vaccines, medicines, education, and plans you will need. A1c test  Performed at least 2 times a year if you are meeting treatment goals.  Performed 4 times a year if therapy has changed or if you are not meeting treatment goals. Blood pressure test  Performed at every routine medical visit. The goal is less than 120/80 mmHg. Dental exam  Follow up with the dentist regularly. Eye exam  Diagnosed with type 1 diabetes as a child: Get an exam upon reaching the age of 10 years or older and having had diabetes for 3 5 years. Yearly eye exams are recommended after that initial eye exam.  Diagnosed with type 1 diabetes as an adult: Get an exam within 5 years of diagnosis and then yearly.  Diagnosed with type 2 diabetes: Get an exam as soon as possible after the diagnosis and then yearly. Foot care exam  Visual foot exams are performed at every routine medical visit. The exams check for cuts, injuries, or other problems with the feet.  A comprehensive foot exam should be done yearly. This includes visual inspection as well as assessing foot pulses and testing for loss of sensation. Kidney function test (urine microalbumin)  Performed once a year.  Type 1 diabetes: The first test is performed 5 years after diagnosis.  Type 2 diabetes: The first test is performed at the time of diagnosis.  A serum creatinine and estimated glomerular filtration rate (eGFR) test is done once a year to tell the level of chronic kidney disease (CKD), if present. Lipid profile (Cholesterol, HDL, LDL, Triglycerides)  Performed every 5 years for most people.  The  goal for LDL is less than 100 mg/dl. If at high risk, the goal is less than 70 mg/dl.  The goal for HDL is 40 mg/dl 50 mg/dl for men and 50 mg/dl 60 mg/dl for women. An HDL cholesterol of 60 mg/dL or higher gives some protection against heart disease.  The goal for triglycerides is less than 150 mg/dl. Influenza vaccine, pneumococcal vaccine, and hepatitis B vaccine  The influenza vaccine is recommended yearly.  The pneumococcal vaccine is generally given once in a lifetime. However, there are some instances when another vaccination is recommended. Check with your caregiver.  The hepatitis B vaccine is also recommended for adults with diabetes. Diabetes self-management education  Recommended at diagnosis and ongoing as needed. Treatment plan  Reviewed at every medical visit. Document Released: 09/19/2009 Document Revised: 11/08/2012 Document Reviewed: 05/25/2011 ExitCare Patient Information 2014 [employer].  

## 2013-06-04 NOTE — Assessment & Plan Note (Signed)
Stable con't meds 

## 2013-06-04 NOTE — Assessment & Plan Note (Signed)
With weakness in L foot Pt will bring in MRI she had in [place] She was seeing chiropractor May need Neurosurgeon/ or orth

## 2013-06-04 NOTE — Assessment & Plan Note (Signed)
Check labs 

## 2013-06-04 NOTE — Progress Notes (Signed)
  Subjective:    Patient ID: Janice Norman, female    DOB: Jun 28, 1933, 79 y.o.   MRN: [id number]  HPI HYPERTENSION Disease Monitoring Blood pressure range-good  Chest pain- no      Dyspnea- no Medications Compliance- good  Lightheadedness- no   Edema- no   DIABETES Disease Monitoring Blood Sugar ranges-129-175 Polyuria- no New Visual problems- no Medications Compliance- good Hypoglycemic symptoms- no   HYPERLIPIDEMIA Disease Monitoring See symptoms for Hypertension Medications Compliance- good  RUQ pain- no  Muscle aches- no  ROS See HPI above   PMH Smoking Status noted     Review of Systems     Objective:   Physical Exam BP 126/60  Pulse 68  Temp(Src) 97.4 F (36.3 C) (Oral)  Ht 5\' 2"  (1.575 m)  Wt 159 lb 6.4 oz (72.303 kg)  BMI 29.15 kg/m2  SpO2 98% General appearance: alert, cooperative, appears stated age and no distress Neck: no adenopathy, no carotid bruit, no JVD, supple, symmetrical, trachea midline and thyroid not enlarged, symmetric, no tenderness/mass/nodules Lungs: clear to auscultation bilaterally Heart: S1, S2 normal Extremities: extremities normal, atraumatic, no cyanosis or edema Neurologic: weakness in L foot plantar flexion                   DTR = and b/l  Sensory exam of the foot is normal, tested with the monofilament. Good pulses, no lesions or ulcers, good peripheral pulses.       Assessment & Plan:

## 2013-06-04 NOTE — Addendum Note (Signed)
Addended by: Silvio Pate D on: 06/04/2013 02:03 PM   Modules accepted: Orders

## 2013-06-04 NOTE — Assessment & Plan Note (Signed)
Check labs con't meds 

## 2013-06-05 ENCOUNTER — Other Ambulatory Visit: Payer: Self-pay

## 2013-06-05 MED ORDER — SIMVASTATIN 40 MG PO TABS: 40.0000 mg | ORAL_TABLET | Freq: Every evening | ORAL | Status: DC

## 2013-06-07 LAB — URINE CULTURE: Colony Count: 30000

## 2013-06-11 ENCOUNTER — Telehealth: Payer: Self-pay | Admitting: *Deleted

## 2013-06-11 MED ORDER — PIOGLITAZONE HCL 45 MG PO TABS: 45.0000 mg | ORAL_TABLET | Freq: Every day | ORAL | Status: DC

## 2013-06-11 NOTE — Telephone Encounter (Signed)
Pt daughter left VM that Pt is concern about her blood sugar since actos was D/C. Pt daughter wants to know if there will be a med to replace the actos that was D/C. Per labs Diabetes is uncontrolled-- but we increased th Glucophage already Since you have been on the actos I will leave it for now but will consider taking it away later. Pt daughter made aware. Rx sent

## 2013-06-18 ENCOUNTER — Other Ambulatory Visit: Payer: Self-pay | Admitting: Family Medicine

## 2013-06-18 ENCOUNTER — Telehealth: Payer: Self-pay | Admitting: *Deleted

## 2013-06-18 DIAGNOSIS — Z0000 Encounter for general adult medical examination without abnormal findings: Secondary | ICD-10-CM

## 2013-06-18 NOTE — Telephone Encounter (Signed)
Pt daughter left VM that at last OV Dr Laury Axon mention a colonoscopy.Pt daughter would like to know if she needs to set this up or will we do this.Please advise If you would Pt to be referred

## 2013-06-18 NOTE — Telephone Encounter (Signed)
Gi will call them

## 2013-06-19 NOTE — Telephone Encounter (Signed)
Discuss with patient daughter.

## 2013-06-21 ENCOUNTER — Encounter: Payer: Self-pay | Admitting: Internal Medicine

## 2013-06-29 ENCOUNTER — Encounter: Payer: Self-pay | Admitting: Gynecology

## 2013-06-29 ENCOUNTER — Ambulatory Visit: Payer: Medicare Other | Attending: Gynecology | Admitting: Gynecology

## 2013-06-29 VITALS — BP 138/62 | HR 64 | Temp 97.7°F | Resp 18 | Ht 60.95 in | Wt 158.4 lb

## 2013-06-29 DIAGNOSIS — Z9079 Acquired absence of other genital organ(s): Secondary | ICD-10-CM | POA: Insufficient documentation

## 2013-06-29 DIAGNOSIS — Z90710 Acquired absence of both cervix and uterus: Secondary | ICD-10-CM | POA: Insufficient documentation

## 2013-06-29 DIAGNOSIS — Z79899 Other long term (current) drug therapy: Secondary | ICD-10-CM | POA: Diagnosis not present

## 2013-06-29 DIAGNOSIS — C541 Malignant neoplasm of endometrium: Secondary | ICD-10-CM | POA: Insufficient documentation

## 2013-06-29 DIAGNOSIS — I10 Essential (primary) hypertension: Secondary | ICD-10-CM | POA: Diagnosis not present

## 2013-06-29 DIAGNOSIS — C549 Malignant neoplasm of corpus uteri, unspecified: Secondary | ICD-10-CM | POA: Insufficient documentation

## 2013-06-29 NOTE — Progress Notes (Signed)
Consult Note: Gyn-Onc   Janice Norman 79 y.o. female  Chief Complaint  Patient presents with  . Endometrial cancer    Follow up    Assessment : Grade 1 stage IB endometrial cancer 2010. Clinically free of disease. The patient has recently had a Pap smear that was normal.  Plan:She returned to see [place] in one year for surveillance. It is recommended that she have annual mammograms. She is scheduled to have colonoscopy in the near future. :   HPI: 79 year old white widowed female seen in consultation request of Dr. Deirdre Pippins for continued followup of an endometrial cancer. Patient underwent a total abdominal hysterectomy bilateral salpingo-oophorectomy pelvic and para-aortic lymphadenectomy in June 2010 in [place]. She was found to have a tumor that was deeply invasive with lymph vascular space involvement. All lymph nodes were negative. Postoperative hole pelvis radiation therapy was recommended. The patient received 17 fractions of radiation therapy but because of diarrhea discontinued before completing the full course had been recommended. The patient has not had consistent followup since then but apparently has done well.  Review of Systems:10 point review of systems is negative except as noted in interval history.   Vitals: Blood pressure 138/62, pulse 64, temperature 97.7 F (36.5 C), temperature source Oral, resp. rate 18, height 5' 0.95" (1.548 m), weight 158 lb 6.4 oz (71.85 kg).  Physical Exam: General : The patient is a healthy woman in no acute distress.  HEENT: normocephalic, extraoccular movements normal; neck is supple without thyromegally  Lynphnodes: Supraclavicular and inguinal nodes not enlarged  Abdomen: Soft, non-tender, no ascites, no organomegally, no masses, no hernias  Pelvic:  EGBUS: Normal female, there is some erythema of the vulva  Vagina: Normal, no lesions , atrophic Urethra and Bladder: Normal, non-tender  Cervix: Surgically absent   Uterus: Surgically absent  Bi-manual examination: Non-tender; no adenxal masses or nodularity  Rectal: normal sphincter tone, no masses, no blood  Lower extremities: No edema or varicosities. Normal range of motion      No Known Allergies  Past Medical History  Diagnosis Date  . Glaucoma   . Hypertension   . Diabetes mellitus without complication   . Endometrial cancer 05/19/2009    TAHysterectomy  . Uterine cancer     Past Surgical History  Procedure Laterality Date  . Abdominal hysterectomy  2010  . Tumor removal  1980    left ovary    Current Outpatient Prescriptions  Medication Sig Dispense Refill  . enalapril (VASOTEC) 20 MG tablet Take 20 mg by mouth daily.      . hydrochlorothiazide (HYDRODIURIL) 50 MG tablet Take 1 tablet (50 mg total) by mouth daily.  90 tablet  3  . metFORMIN (GLUCOPHAGE XR) 500 MG 24 hr tablet 2 po qpm  60 tablet  2  . pantoprazole (PROTONIX) 40 MG tablet Take 1 tablet (40 mg total) by mouth daily.  90 tablet  3  . pioglitazone (ACTOS) 45 MG tablet Take 1 tablet (45 mg total) by mouth daily.  30 tablet  2  . simvastatin (ZOCOR) 40 MG tablet Take 1 tablet (40 mg total) by mouth every evening.  30 tablet  2  . traZODone (DESYREL) 50 MG tablet Take 2 tablets (100 mg total) by mouth at bedtime.  180 tablet  3  . zoster vaccine live, PF, (ZOSTAVAX) 16109 UNT/0.65ML injection Inject 19,400 Units into the skin once.  1 vial  0   No current facility-administered medications for this visit.  History   Social History  . Marital Status: Widowed    Spouse Name: N/A    Number of Children: N/A  . Years of Education: N/A   Occupational History  . Not on file.   Social History Main Topics  . Smoking status: Never Smoker   . Smokeless tobacco: Not on file  . Alcohol Use: Yes  . Drug Use: No  . Sexually Active: Not on file   Other Topics Concern  . Not on file   Social History Narrative  . No narrative on file    Family History  Problem  Relation Age of Onset  . Hypertension Father   . Diabetes Father   . Diabetes Sister   . Hypertension Sister       Jeannette Corpus, MD 06/29/2013, 3:35 PM

## 2013-06-29 NOTE — Patient Instructions (Signed)
Please be sure to teacher mammogram and colonoscopy as scheduled. We will plan on seeing you again in one year.

## 2013-07-02 ENCOUNTER — Other Ambulatory Visit (HOSPITAL_BASED_OUTPATIENT_CLINIC_OR_DEPARTMENT_OTHER): Payer: Self-pay | Admitting: Obstetrics and Gynecology

## 2013-07-02 DIAGNOSIS — H2510 Age-related nuclear cataract, unspecified eye: Secondary | ICD-10-CM | POA: Diagnosis not present

## 2013-07-02 DIAGNOSIS — Z1231 Encounter for screening mammogram for malignant neoplasm of breast: Secondary | ICD-10-CM

## 2013-07-02 DIAGNOSIS — H269 Unspecified cataract: Secondary | ICD-10-CM | POA: Diagnosis not present

## 2013-07-03 ENCOUNTER — Ambulatory Visit (INDEPENDENT_AMBULATORY_CARE_PROVIDER_SITE_OTHER): Payer: Medicare Other

## 2013-07-03 DIAGNOSIS — Z23 Encounter for immunization: Secondary | ICD-10-CM | POA: Diagnosis not present

## 2013-07-03 DIAGNOSIS — Z2911 Encounter for prophylactic immunotherapy for respiratory syncytial virus (RSV): Secondary | ICD-10-CM

## 2013-07-03 DIAGNOSIS — H2510 Age-related nuclear cataract, unspecified eye: Secondary | ICD-10-CM | POA: Diagnosis not present

## 2013-07-06 ENCOUNTER — Ambulatory Visit (HOSPITAL_BASED_OUTPATIENT_CLINIC_OR_DEPARTMENT_OTHER)
Admission: RE | Admit: 2013-07-06 | Discharge: 2013-07-06 | Disposition: A | Discharge status: Home / Self Care | Payer: Medicare Other | Source: Ambulatory Visit | Attending: Obstetrics and Gynecology | Admitting: Obstetrics and Gynecology

## 2013-07-06 DIAGNOSIS — Z1231 Encounter for screening mammogram for malignant neoplasm of breast: Secondary | ICD-10-CM | POA: Diagnosis not present

## 2013-07-23 DIAGNOSIS — H269 Unspecified cataract: Secondary | ICD-10-CM | POA: Diagnosis not present

## 2013-07-23 DIAGNOSIS — H2510 Age-related nuclear cataract, unspecified eye: Secondary | ICD-10-CM | POA: Diagnosis not present

## 2013-07-26 ENCOUNTER — Encounter: Payer: Self-pay | Admitting: Family Medicine

## 2013-08-08 ENCOUNTER — Ambulatory Visit (AMBULATORY_SURGERY_CENTER): Payer: Self-pay

## 2013-08-08 VITALS — Ht 61.0 in | Wt 157.2 lb

## 2013-08-08 DIAGNOSIS — Z860100 Personal history of colon polyps, unspecified: Secondary | ICD-10-CM

## 2013-08-08 MED ORDER — MOVIPREP 100 G PO SOLR: 1.0000 | Freq: Once | ORAL | Status: DC

## 2013-08-15 ENCOUNTER — Other Ambulatory Visit: Payer: Self-pay

## 2013-08-15 ENCOUNTER — Other Ambulatory Visit: Payer: Self-pay | Admitting: *Deleted

## 2013-08-15 MED ORDER — ENALAPRIL MALEATE 20 MG PO TABS: 20.0000 mg | ORAL_TABLET | Freq: Every day | ORAL | Status: DC

## 2013-08-15 NOTE — Telephone Encounter (Signed)
Rx was refilled for vasotec.  Ag cma

## 2013-08-23 ENCOUNTER — Encounter: Payer: Self-pay | Admitting: Internal Medicine

## 2013-08-23 ENCOUNTER — Ambulatory Visit (AMBULATORY_SURGERY_CENTER): Payer: Medicare Other | Admitting: Internal Medicine

## 2013-08-23 VITALS — BP 144/82 | HR 57 | Temp 97.2°F | Resp 13 | Ht 61.0 in | Wt 157.0 lb

## 2013-08-23 DIAGNOSIS — Z860100 Personal history of colon polyps, unspecified: Secondary | ICD-10-CM

## 2013-08-23 DIAGNOSIS — D126 Benign neoplasm of colon, unspecified: Secondary | ICD-10-CM | POA: Diagnosis not present

## 2013-08-23 DIAGNOSIS — Z1211 Encounter for screening for malignant neoplasm of colon: Secondary | ICD-10-CM | POA: Diagnosis not present

## 2013-08-23 DIAGNOSIS — E119 Type 2 diabetes mellitus without complications: Secondary | ICD-10-CM | POA: Diagnosis not present

## 2013-08-23 DIAGNOSIS — I10 Essential (primary) hypertension: Secondary | ICD-10-CM | POA: Diagnosis not present

## 2013-08-23 MED ORDER — SODIUM CHLORIDE 0.9 % IV SOLN: 500.0000 mL | INTRAVENOUS | Status: DC

## 2013-08-23 NOTE — Patient Instructions (Addendum)

## 2013-08-23 NOTE — Op Note (Signed)
[facility] [address] [place], [address]   COLONOSCOPY PROCEDURE REPORT  PATIENT: Janice Norman, Janice Norman  MR#: [id number] BIRTHDATE: 23-Jul-1933 , 80  yrs. old GENDER: Female ENDOSCOPIST: Roxy Cedar, MD REFERRED BY:.  [facility] PROCEDURE DATE:  08/23/2013 PROCEDURE:   Colonoscopy with snare polypectomy x 2 First Screening Colonoscopy - Avg.  risk and is 50 yrs.  old or older - No.  Prior Negative Screening - Now for repeat screening. N/A  History of Adenoma - Now for follow-up colonoscopy & has been > or = to 3 yrs.  Yes hx of adenoma.  Has been 3 or more years since last colonoscopy.  Polyps Removed Today? Yes. ASA CLASS:   Class II INDICATIONS:Patient's personal history of adenomatous colon polyps. FLA remote. Last [facility] 10-2007 (-) - instructed to f/u 5 yrs MEDICATIONS: MAC sedation, administered by CRNA and propofol (Diprivan) 120mg  IV  DESCRIPTION OF PROCEDURE:   After the risks benefits and alternatives of the procedure were thoroughly explained, informed consent was obtained.  A digital rectal exam revealed no abnormalities of the rectum.   The LB [id number]  endoscope was introduced through the anus and advanced to the cecum, which was identified by both the appendix and ileocecal valve. No adverse events experienced.   The quality of the prep was excellent, using MoviPrep  The instrument was then slowly withdrawn as the colon was fully examined.  COLON FINDINGS: Two diminutive polyps were found at the cecum and in the ascending colon.  A polypectomy was performed with a cold snare.  The resection was complete and the polyp tissue was completely retrieved.   Moderate diverticulosis was noted in the sigmoid colon.   The colon mucosa was otherwise normal. Retroflexed views revealed internal hemorrhoids. The time to cecum=2 minutes 53 seconds.  Withdrawal time=9 minutes 39 seconds. The scope was withdrawn and the procedure  completed. COMPLICATIONS: There were no complications.  ENDOSCOPIC IMPRESSION: 1.   Two diminutive polyps were  in the colon; polypectomy was performed with a cold snare 2.   Moderate diverticulosis was noted in the sigmoid colon 3.   The colon mucosa was otherwise normal  RECOMMENDATIONS: 1. Return to the care of your primary provider.  GI follow up as needed   eSigned:  Roxy Cedar, MD 08/23/2013 9:05 AM   cc: Lelon Perla, DO and The Patient

## 2013-08-23 NOTE — Progress Notes (Signed)
Patient did not experience any of the following events: a burn prior to discharge; a fall within the facility; wrong site/side/patient/procedure/implant event; or a hospital transfer or hospital admission upon discharge from the facility. (G8907) Patient did not have preoperative order for IV antibiotic SSI prophylaxis. (G8918)  

## 2013-08-23 NOTE — Progress Notes (Signed)
Stable to RR 

## 2013-08-23 NOTE — Progress Notes (Signed)
Called to room to assist during endoscopic procedure.  Patient ID and intended procedure confirmed with present staff. Received instructions for my participation in the procedure from the performing physician.  

## 2013-08-24 ENCOUNTER — Telehealth: Payer: Self-pay

## 2013-08-24 NOTE — Telephone Encounter (Signed)
Left message on answering machine. 

## 2013-08-28 ENCOUNTER — Encounter: Payer: Self-pay | Admitting: Internal Medicine

## 2013-09-05 ENCOUNTER — Other Ambulatory Visit: Payer: Self-pay | Admitting: Family Medicine

## 2013-09-17 ENCOUNTER — Other Ambulatory Visit: Payer: Self-pay | Admitting: Family Medicine

## 2013-09-18 ENCOUNTER — Other Ambulatory Visit: Payer: Self-pay | Admitting: Family Medicine

## 2013-10-11 ENCOUNTER — Other Ambulatory Visit: Payer: Self-pay

## 2013-10-24 ENCOUNTER — Telehealth: Payer: Self-pay | Admitting: *Deleted

## 2013-10-24 DIAGNOSIS — G4700 Insomnia, unspecified: Secondary | ICD-10-CM

## 2013-10-24 DIAGNOSIS — R29898 Other symptoms and signs involving the musculoskeletal system: Secondary | ICD-10-CM

## 2013-10-24 NOTE — Telephone Encounter (Addendum)
Patient's daughter called and and stated that she had talked about getting her mother a referral to neurology. Daughter states that patient patient is unable to sleep due to pain in her legs. Would like to go forward with the referral. Please advise. SW

## 2013-10-24 NOTE — Telephone Encounter (Signed)
Ok to refer to neuro for leg pain and foot weakness and insomnia

## 2013-10-25 NOTE — Telephone Encounter (Signed)
Referral placed.

## 2013-10-31 ENCOUNTER — Other Ambulatory Visit: Payer: Self-pay | Admitting: Family Medicine

## 2013-11-23 ENCOUNTER — Other Ambulatory Visit: Payer: Self-pay | Admitting: Family Medicine

## 2013-11-27 ENCOUNTER — Ambulatory Visit (INDEPENDENT_AMBULATORY_CARE_PROVIDER_SITE_OTHER): Payer: Medicare Other

## 2013-11-27 DIAGNOSIS — Z23 Encounter for immunization: Secondary | ICD-10-CM | POA: Diagnosis not present

## 2013-11-27 DIAGNOSIS — H4011X0 Primary open-angle glaucoma, stage unspecified: Secondary | ICD-10-CM | POA: Diagnosis not present

## 2013-12-04 ENCOUNTER — Other Ambulatory Visit: Payer: Self-pay | Admitting: Family Medicine

## 2013-12-17 DIAGNOSIS — R29898 Other symptoms and signs involving the musculoskeletal system: Secondary | ICD-10-CM | POA: Diagnosis not present

## 2013-12-17 DIAGNOSIS — M545 Low back pain, unspecified: Secondary | ICD-10-CM | POA: Diagnosis not present

## 2013-12-28 DIAGNOSIS — M545 Low back pain, unspecified: Secondary | ICD-10-CM | POA: Diagnosis not present

## 2013-12-28 DIAGNOSIS — M5137 Other intervertebral disc degeneration, lumbosacral region: Secondary | ICD-10-CM | POA: Diagnosis not present

## 2014-01-12 ENCOUNTER — Ambulatory Visit (INDEPENDENT_AMBULATORY_CARE_PROVIDER_SITE_OTHER): Payer: Medicare Other | Admitting: Family Medicine

## 2014-01-12 ENCOUNTER — Encounter: Payer: Self-pay | Admitting: Family Medicine

## 2014-01-12 ENCOUNTER — Other Ambulatory Visit: Payer: Self-pay | Admitting: Family Medicine

## 2014-01-12 VITALS — BP 118/60 | HR 74 | Temp 98.8°F | Wt 155.0 lb

## 2014-01-12 DIAGNOSIS — R1031 Right lower quadrant pain: Secondary | ICD-10-CM | POA: Diagnosis not present

## 2014-01-12 DIAGNOSIS — N390 Urinary tract infection, site not specified: Secondary | ICD-10-CM | POA: Diagnosis not present

## 2014-01-12 DIAGNOSIS — R197 Diarrhea, unspecified: Secondary | ICD-10-CM

## 2014-01-12 DIAGNOSIS — R1032 Left lower quadrant pain: Secondary | ICD-10-CM

## 2014-01-12 DIAGNOSIS — R112 Nausea with vomiting, unspecified: Secondary | ICD-10-CM

## 2014-01-12 DIAGNOSIS — K529 Noninfective gastroenteritis and colitis, unspecified: Secondary | ICD-10-CM | POA: Insufficient documentation

## 2014-01-12 DIAGNOSIS — R300 Dysuria: Secondary | ICD-10-CM

## 2014-01-12 LAB — POCT URINALYSIS DIPSTICK
Bilirubin, UA: NEGATIVE
Glucose, UA: NEGATIVE
KETONES UA: NEGATIVE
PROTEIN UA: NEGATIVE
RBC UA: NEGATIVE
SPEC GRAV UA: 1.01
Urobilinogen, UA: NEGATIVE
pH, UA: 6

## 2014-01-12 MED ORDER — CIPROFLOXACIN HCL 250 MG PO TABS: 250.0000 mg | ORAL_TABLET | Freq: Two times a day (BID) | ORAL | Status: DC

## 2014-01-12 NOTE — Progress Notes (Signed)
   Subjective:    Patient ID: Janice Norman, female    DOB: 03-05-1933, 80 y.o.   MRN: [id number]  Emesis  Associated symptoms include abdominal pain, chills and diarrhea. Pertinent negatives include no chest pain, coughing or fever.  Diarrhea  Associated symptoms include abdominal pain, chills and vomiting. Pertinent negatives include no coughing or fever.    80 year old female pt of Dr. Etter Sjogren with history of endometrial ca 2010, DM poorly controlled  on glucophage long time( 06/2013, 7.0), HTN presents with new onset in last 24 hours abdominal pain, vomiting and diarrhea. Abdominal pain periumbilical Yesterday nausea, chills, no fever. Emesis twice yesterday, none today. No blood in emesis. Diarrhea yesterday 5 watery BMs , today 2 BMs more firm but still watery. No blood in BMs.  She has noted dysuria off and on ( in last year, occ uses azo helps for a while)... Now ongoing x 1 week. Urinary frequency.  She has noted every month in the last 7-8 months she has episodes of nausea, abdominal pain, nausea and diarrhea.. Lasts 2-3 days.  She has history of tendency to diarrhea in past.  Cymbalta is new in last 1 month for peripheral neuropathy, Has helped.  Has gallbladder, s/p appendectomy.  Dr. Henrene Pastor, colonoscopy unremarkable 08/2013.      Review of Systems  Constitutional: Positive for chills and fatigue. Negative for fever.  HENT: Negative for ear pain.   Eyes: Negative for pain.  Respiratory: Negative for cough and shortness of breath.   Cardiovascular: Negative for chest pain.  Gastrointestinal: Positive for vomiting, abdominal pain and diarrhea.       Objective:   Physical Exam  Constitutional:  Elderly female in NAD  HENT:  Head: Normocephalic and atraumatic.  Right Ear: External ear normal.  Left Ear: External ear normal.  Nose: Nose normal.  Mouth/Throat: Oropharynx is clear and moist. No oropharyngeal exudate.  Eyes: Conjunctivae are normal. Pupils are  equal, round, and reactive to light. Right eye exhibits no discharge. Left eye exhibits no discharge.  Neck: Normal range of motion. Neck supple. No thyromegaly present.  Cardiovascular: Normal rate and regular rhythm.   No murmur heard. Pulmonary/Chest: Breath sounds normal. No respiratory distress. She has no wheezes. She has no rales. She exhibits no tenderness.  Abdominal: There is no hepatosplenomegaly. There is tenderness in the right lower quadrant, suprapubic area and left lower quadrant. There is no rigidity, no rebound, no guarding and no CVA tenderness. No hernia.            Assessment & Plan:   Very chronic nature of her concerns today.  UA appear suggestive of UTI... Will treat with cipro.   N/V/D and abdominal pain may be related.. But if not improving with UTI treatment would recommend lab eval and possible  abd imaging .  She is nontoxic and with minimal pain today, mainly suprapubic. She will follow up with PCP next week.

## 2014-01-12 NOTE — Progress Notes (Signed)
Pre-visit discussion using our clinic review tool. No additional management support is needed unless otherwise documented below in the visit note.  

## 2014-01-12 NOTE — Patient Instructions (Addendum)
Cipro twice daily x 7 days for UTI. We will send urine for culture.  Schedule follow up with PCP in next week for further eval of chronic issues.   Follow blood sugar closely with infection.

## 2014-01-14 NOTE — Telephone Encounter (Signed)
Med filled.  

## 2014-01-15 LAB — URINE CULTURE: Colony Count: >=100000

## 2014-01-17 ENCOUNTER — Encounter: Payer: Self-pay | Admitting: Family Medicine

## 2014-01-17 ENCOUNTER — Encounter: Payer: Self-pay | Admitting: Internal Medicine

## 2014-01-17 ENCOUNTER — Ambulatory Visit (INDEPENDENT_AMBULATORY_CARE_PROVIDER_SITE_OTHER): Payer: Medicare Other | Admitting: Family Medicine

## 2014-01-17 VITALS — BP 116/60 | HR 62 | Temp 98.3°F | Wt 155.0 lb

## 2014-01-17 DIAGNOSIS — R197 Diarrhea, unspecified: Secondary | ICD-10-CM | POA: Diagnosis not present

## 2014-01-17 DIAGNOSIS — IMO0002 Reserved for concepts with insufficient information to code with codable children: Secondary | ICD-10-CM

## 2014-01-17 DIAGNOSIS — K529 Noninfective gastroenteritis and colitis, unspecified: Secondary | ICD-10-CM

## 2014-01-17 DIAGNOSIS — N390 Urinary tract infection, site not specified: Secondary | ICD-10-CM

## 2014-01-17 DIAGNOSIS — E118 Type 2 diabetes mellitus with unspecified complications: Secondary | ICD-10-CM

## 2014-01-17 DIAGNOSIS — E1165 Type 2 diabetes mellitus with hyperglycemia: Secondary | ICD-10-CM | POA: Diagnosis not present

## 2014-01-17 LAB — POCT URINALYSIS DIPSTICK
BILIRUBIN UA: NEGATIVE
Blood, UA: NEGATIVE
Glucose, UA: NEGATIVE
Ketones, UA: NEGATIVE
Nitrite, UA: POSITIVE
SPEC GRAV UA: 1.01
Urobilinogen, UA: 0.2
pH, UA: 6.5

## 2014-01-17 LAB — BASIC METABOLIC PANEL
BUN: 23 mg/dL (ref 6–23)
CO2: 29 mEq/L (ref 19–32)
Calcium: 9.2 mg/dL (ref 8.4–10.5)
Chloride: 101 mEq/L (ref 96–112)
Creatinine, Ser: 0.8 mg/dL (ref 0.4–1.2)
GFR: 75.83 mL/min (ref >60.00)
Glucose, Bld: 130 mg/dL — ABNORMAL HIGH (ref 70–99)
Potassium: 3.6 mEq/L (ref 3.5–5.1)
Sodium: 138 mEq/L (ref 135–145)

## 2014-01-17 LAB — LIPID PANEL
CHOL/HDL RATIO: 3
Cholesterol: 132 mg/dL (ref 0–200)
HDL: 45 mg/dL (ref >39.00)
LDL CALC: 71 mg/dL (ref 0–99)
Triglycerides: 78 mg/dL (ref 0.0–149.0)
VLDL: 15.6 mg/dL (ref 0.0–40.0)

## 2014-01-17 LAB — HEPATIC FUNCTION PANEL
ALK PHOS: 42 U/L (ref 39–117)
ALT: 13 U/L (ref 0–35)
AST: 19 U/L (ref 0–37)
Albumin: 3.7 g/dL (ref 3.5–5.2)
BILIRUBIN TOTAL: 0.8 mg/dL (ref 0.3–1.2)
Bilirubin, Direct: 0.1 mg/dL (ref 0.0–0.3)
Total Protein: 7.2 g/dL (ref 6.0–8.3)

## 2014-01-17 LAB — HEMOGLOBIN A1C: HEMOGLOBIN A1C: 6.8 % — AB (ref 4.6–6.5)

## 2014-01-17 MED ORDER — PIOGLITAZONE HCL 45 MG PO TABS: ORAL_TABLET | ORAL | Status: DC

## 2014-01-17 MED ORDER — METFORMIN HCL ER 500 MG PO TB24: ORAL_TABLET | ORAL | Status: DC

## 2014-01-17 NOTE — Progress Notes (Signed)
  Subjective:    Janice Norman is a 80 y.o. female who complains of burning with urination, frequency and suprapubic pressure. She has had symptoms for 10 days. Patient also complains of back pain. Patient denies congestion, cough, fever, headache, rhinitis, sorethroat, stomach ache and vaginal discharge. Patient does have a history of recurrent UTI. Patient does not have a history of pyelonephritis.   The following portions of the patient's history were reviewed and updated as appropriate: allergies, current medications, past family history, past medical history, past social history, past surgical history and problem list.  Review of Systems Pertinent items are noted in HPI.    Objective:    BP 116/60  Pulse 62  Temp(Src) 98.3 F (36.8 C) (Oral)  Wt 155 lb (70.308 kg)  SpO2 97% General appearance: alert, cooperative, appears stated age and no distress Abdomen: soft, non-tender; bowel sounds normal; no masses,  no organomegaly  Laboratory:  Urine dipstick: 3+ for leukocyte esterase and positive for nitrites.   Micro exam: not done.    Assessment:    Acute cystitis and UTI     Plan:    Medications: levaquin 500 mg . Maintain adequate hydration. Follow up if symptoms not improving, and as needed. refer to urology   1. UTI (urinary tract infection)  - POCT Urinalysis Dipstick - Urine Culture  2. Chronic diarrhea Refer to GI-- pt with hx IBS and it has been bothering her more regularly - Ambulatory referral to Gastroenterology  3. Type II or unspecified type diabetes mellitus with unspecified complication, uncontrolled con't meds, check labs - Basic metabolic panel - Hemoglobin A1c - Hepatic function panel - Lipid panel  4. Recurrent UTI Change abx to levaquin - Ambulatory referral to Urology

## 2014-01-17 NOTE — Progress Notes (Signed)
Pre visit review using our clinic review tool, if applicable. No additional management support is needed unless otherwise documented below in the visit note. 

## 2014-01-17 NOTE — Patient Instructions (Addendum)
Urinary Tract Infection Urinary tract infections (UTIs) can develop anywhere along your urinary tract. Your urinary tract is your body's drainage system for removing wastes and extra water. Your urinary tract includes two kidneys, two ureters, a bladder, and a urethra. Your kidneys are a pair of bean-shaped organs. Each kidney is about the size of your fist. They are located below your ribs, one on each side of your spine. CAUSES Infections are caused by microbes, which are microscopic organisms, including fungi, viruses, and bacteria. These organisms are so small that they can only be seen through a microscope. Bacteria are the microbes that most commonly cause UTIs. SYMPTOMS  Symptoms of UTIs may vary by age and gender of the patient and by the location of the infection. Symptoms in young women typically include a frequent and intense urge to urinate and a painful, burning feeling in the bladder or urethra during urination. Older women and men are more likely to be tired, shaky, and weak and have muscle aches and abdominal pain. A fever may mean the infection is in your kidneys. Other symptoms of a kidney infection include pain in your back or sides below the ribs, nausea, and vomiting. DIAGNOSIS To diagnose a UTI, your caregiver will ask you about your symptoms. Your caregiver also will ask to provide a urine sample. The urine sample will be tested for bacteria and white blood cells. White blood cells are made by your body to help fight infection. TREATMENT  Typically, UTIs can be treated with medication. Because most UTIs are caused by a bacterial infection, they usually can be treated with the use of antibiotics. The choice of antibiotic and length of treatment depend on your symptoms and the type of bacteria causing your infection. HOME CARE INSTRUCTIONS  If you were prescribed antibiotics, take them exactly as your caregiver instructs you. Finish the medication even if you feel better after you  have only taken some of the medication.  Drink enough water and fluids to keep your urine clear or pale yellow.  Avoid caffeine, tea, and carbonated beverages. They tend to irritate your bladder.  Empty your bladder often. Avoid holding urine for long periods of time.  Empty your bladder before and after sexual intercourse.  After a bowel movement, women should cleanse from front to back. Use each tissue only once. SEEK MEDICAL CARE IF:   You have back pain.  You develop a fever.  Your symptoms do not begin to resolve within 3 days. SEEK IMMEDIATE MEDICAL CARE IF:   You have severe back pain or lower abdominal pain.  You develop chills.  You have nausea or vomiting.  You have continued burning or discomfort with urination. MAKE SURE YOU:   Understand these instructions.  Will watch your condition.  Will get help right away if you are not doing well or get worse. Document Released: 09/01/2005 Document Revised: 05/23/2012 Document Reviewed: 12/31/2011 [facility] Patient Information 2014 [place]. Diabetes and Standards of Medical Care  Diabetes is complicated. You may find that your diabetes team includes a dietitian, nurse, diabetes educator, eye doctor, and more. To help everyone know what is going on and to help you get the care you deserve, the following schedule of care was developed to help keep you on track. Below are the tests, exams, vaccines, medicines, education, and plans you will need. HbA1c test This test shows how well you have controlled your glucose over the past 2 3 months. It is used to see if your diabetes  management plan needs to be adjusted.   It is performed at least 2 times a year if you are meeting treatment goals.  It is performed 4 times a year if therapy has changed or if you are not meeting treatment goals. Blood pressure test  This test is performed at every routine medical visit. The goal is less than 140/90 mmHg for most people, but  130/80 mmHg in some cases. Ask your health care provider about your goal. Dental exam  Follow up with the dentist regularly. Eye exam  If you are diagnosed with type 1 diabetes as a child, get an exam upon reaching the age of 66 years or older and have had diabetes for 3 5 years. Yearly eye exams are recommended after that initial eye exam.  If you are diagnosed with type 1 diabetes as an adult, get an exam within 5 years of diagnosis and then yearly.  If you are diagnosed with type 2 diabetes, get an exam as soon as possible after the diagnosis and then yearly. Foot care exam  Visual foot exams are performed at every routine medical visit. The exams check for cuts, injuries, or other problems with the feet.  A comprehensive foot exam should be done yearly. This includes visual inspection as well as assessing foot pulses and testing for loss of sensation.  Check your feet nightly for cuts, injuries, or other problems with your feet. Tell your health care provider if anything is not healing. Kidney function test (urine microalbumin)  This test is performed once a year.  Type 1 diabetes: The first test is performed 5 years after diagnosis.  Type 2 diabetes: The first test is performed at the time of diagnosis.  A serum creatinine and estimated glomerular filtration rate (eGFR) test is done once a year to assess the level of chronic kidney disease (CKD), if present. Lipid profile (cholesterol, HDL, LDL, triglycerides)  Performed every 5 years for most people.  The goal for LDL is less than 100 mg/dL. If you are at high risk, the goal is less than 70 mg/dL.  The goal for HDL is 40 mg/dL 50 mg/dL for men and 50 mg/dL 60 mg/dL for women. An HDL cholesterol of 60 mg/dL or higher gives some protection against heart disease.  The goal for triglycerides is less than 150 mg/dL. Influenza vaccine, pneumococcal vaccine, and hepatitis B vaccine  The influenza vaccine is recommended  yearly.  The pneumococcal vaccine is generally given once in a lifetime. However, there are some instances when another vaccination is recommended. Check with your health care provider.  The hepatitis B vaccine is also recommended for adults with diabetes. Diabetes self-management education  Education is recommended at diagnosis and ongoing as needed. Treatment plan  Your treatment plan is reviewed at every medical visit. Document Released: 09/19/2009 Document Revised: 07/25/2013 Document Reviewed: 04/24/2013 [facility] Patient Information 2014 [place].

## 2014-01-18 ENCOUNTER — Telehealth: Payer: Self-pay | Admitting: *Deleted

## 2014-01-18 ENCOUNTER — Telehealth: Payer: Self-pay

## 2014-01-18 ENCOUNTER — Encounter: Payer: Self-pay | Admitting: Family Medicine

## 2014-01-18 LAB — URINE CULTURE
COLONY COUNT: NO GROWTH
Organism ID, Bacteria: NO GROWTH

## 2014-01-18 NOTE — Telephone Encounter (Signed)
Relevant patient education assigned to patient using Emmi. ° °

## 2014-01-18 NOTE — Telephone Encounter (Signed)
Patient called and wanted to speak to a nurse regarding her labs because she is going out of town tomorrow and she needs her medication.

## 2014-01-21 ENCOUNTER — Other Ambulatory Visit: Payer: Self-pay | Admitting: Family Medicine

## 2014-01-21 NOTE — Telephone Encounter (Signed)
Unable to reach pt, will try again.

## 2014-01-23 NOTE — Addendum Note (Signed)
Addended by: Carter Kitten on: 01/23/2014 10:25 AM   Modules accepted: Orders

## 2014-01-25 ENCOUNTER — Telehealth: Payer: Self-pay

## 2014-01-25 NOTE — Telephone Encounter (Signed)
MSG left to call the office      KP 

## 2014-01-25 NOTE — Telephone Encounter (Signed)
The pt called and is hoping to speak with the [facility] regarding her medication at her earliest convenience.   Pt callback - (cell) [phone]

## 2014-02-15 ENCOUNTER — Other Ambulatory Visit: Payer: Self-pay | Admitting: Family Medicine

## 2014-02-18 DIAGNOSIS — H409 Unspecified glaucoma: Secondary | ICD-10-CM | POA: Diagnosis not present

## 2014-02-18 DIAGNOSIS — H4011X0 Primary open-angle glaucoma, stage unspecified: Secondary | ICD-10-CM | POA: Diagnosis not present

## 2014-02-20 ENCOUNTER — Other Ambulatory Visit (INDEPENDENT_AMBULATORY_CARE_PROVIDER_SITE_OTHER): Payer: Medicare Other

## 2014-02-20 ENCOUNTER — Telehealth: Payer: Self-pay

## 2014-02-20 ENCOUNTER — Ambulatory Visit (INDEPENDENT_AMBULATORY_CARE_PROVIDER_SITE_OTHER): Payer: Medicare Other | Admitting: Internal Medicine

## 2014-02-20 ENCOUNTER — Encounter: Payer: Self-pay | Admitting: Internal Medicine

## 2014-02-20 VITALS — BP 134/60 | HR 64 | Ht 61.0 in | Wt 158.2 lb

## 2014-02-20 DIAGNOSIS — K589 Irritable bowel syndrome without diarrhea: Secondary | ICD-10-CM

## 2014-02-20 DIAGNOSIS — R112 Nausea with vomiting, unspecified: Secondary | ICD-10-CM

## 2014-02-20 DIAGNOSIS — R197 Diarrhea, unspecified: Secondary | ICD-10-CM

## 2014-02-20 DIAGNOSIS — Z86010 Personal history of colonic polyps: Secondary | ICD-10-CM

## 2014-02-20 LAB — IGA: IgA: 55 mg/dL — ABNORMAL LOW (ref 68–378)

## 2014-02-20 MED ORDER — RIFAXIMIN 550 MG PO TABS: 550.0000 mg | ORAL_TABLET | Freq: Two times a day (BID) | ORAL | Status: DC

## 2014-02-20 NOTE — Patient Instructions (Signed)
Your physician has requested that you go to the basement for the following lab work before leaving today:  TTG, IGA  You have been given some samples of Xifaxan - Take 2 tablets a day.  You have 8 days worth.  I will call you when I can get samples of the remaining 2 days of the course.  You have been given some information on IBS

## 2014-02-20 NOTE — Progress Notes (Signed)
HISTORY OF PRESENT ILLNESS:  Janice Norman is a 80 y.o. female with multiple medical problems as listed below. She presents today, with her daughter, regarding chronic abdominal complaints. She has not been evaluated in this office for these problems. The patient carries a diagnosis of irritable bowel syndrome. She has a history of adenomatous colon polyps with remote colonoscopy in [place] and most recent colonoscopy in [place] November 2008 (negative). Underwent surveillance colonoscopy with me 08/23/2013. She was found to have diminutive colon polyps and moderate sigmoid diverticulosis. Otherwise normal. The polyps were adenomatous. Due to her age, and the minimal findings, future surveillance not recommended. She now reports a many year history of problems with fecal urgency. Worse over the past year. Rare episodes of incontinence. She tells me that she generally has 1 bowel movement per day which is formed. Occasionally 2 bowel movements. However, there is urgency with bowel movements. Bowel movements are generally in the morning. About once per week, she will have loose stools. Symptoms may or may not be exacerbated by meals. Definitely exacerbated by anxiety. She does not wear protective undergarments. No bleeding. Appetite and weight are stable. She also reports occasional episodes of nausea with vomiting about every 6 weeks. This has been going on for years. She denies problems with abdominal pain. Review of outside blood work from last month find hemoglobin A1c of 6.8. Comprehensive metabolic panel normal except for mildly elevated glucose. Question UTI. Has had recurrent problems with UTI. She has been on metformin for some time. Had been using artificial sweeteners, but not recently. Also, had been taking protein shakes, but not recently. No problems with her bowels over the past week or so.  REVIEW OF SYSTEMS:  All non-GI ROS negative except for arthritis, back pain, excessive urination,  urinary frequency, urinary leakage  Past Medical History  Diagnosis Date  . Glaucoma   . Hypertension   . Diabetes mellitus without complication   . Endometrial cancer 05/19/2009    TAHysterectomy  . Uterine cancer   . Adenomatous polyp of colon 2014  . Diverticulosis   . Anemia   . Arthritis   . IBS (irritable bowel syndrome)   . HLD (hyperlipidemia)     Past Surgical History  Procedure Laterality Date  . Abdominal hysterectomy  2010  . Tumor removal Left 1980    left ovary  . Tonsillectomy      Social History Janice Norman  reports that she has never smoked. She has never used smokeless tobacco. She reports that she drinks alcohol. She reports that she does not use illicit drugs.  family history includes Brain cancer in her father; Diabetes in her father and sister; Heart disease in her father; Hypertension in her father and sister; Stomach cancer in her mother. There is no history of Colon cancer.  No Known Allergies     PHYSICAL EXAMINATION: Vital signs: BP 134/60  Pulse 64  Ht 5\' 1"  (1.549 m)  Wt 158 lb 4 oz (71.782 kg)  BMI 29.92 kg/m2  Constitutional: generally well-appearing, no acute distress Psychiatric: alert and oriented x3, cooperative Eyes: extraocular movements intact, anicteric, conjunctiva pink Mouth: oral pharynx moist, no lesions Neck: supple no lymphadenopathy Cardiovascular: heart regular rate and rhythm, no murmur Lungs: clear to auscultation bilaterally Abdomen: soft, nontender, nondistended, no obvious ascites, no peritoneal signs, normal bowel sounds, no organomegaly Rectal: Omitted Extremities: no lower extremity edema bilaterally Skin: no lesions on visible extremities Neuro: No focal deficits.   ASSESSMENT:  #1. Diarrhea  predominant IBS #2. History of adenomatous colon polyps. Surveillance up-to-date #3. Intermittent nausea with vomiting. Chronic. Etiology unclear #4. General medical problems   PLAN:  #1. Tissue  transglutaminase antibody and serum IgA #2. Empiric course of Xifaxan 550 mg twice a day x10 days. Samples given #3. Okay to use Imodium when going out in social situations is concerned about urgent stools #4. Advised with regards to artificial sweeteners, protein shakes, and metformin as potential cause of agents #5. Office followup in 6 weeks

## 2014-02-20 NOTE — Telephone Encounter (Signed)
Left message with patient's daughter that I had received some Xifaxan samples so that patient's 10 day course would be complete (only had 8 days worth in office).   Left message that I would leave them up front for her to pick up.

## 2014-02-21 DIAGNOSIS — N390 Urinary tract infection, site not specified: Secondary | ICD-10-CM | POA: Diagnosis not present

## 2014-02-21 DIAGNOSIS — N952 Postmenopausal atrophic vaginitis: Secondary | ICD-10-CM | POA: Diagnosis not present

## 2014-02-21 LAB — TISSUE TRANSGLUTAMINASE, IGA: Tissue Transglutaminase Ab, IgA: 1 U/mL (ref <20)

## 2014-02-28 DIAGNOSIS — R29898 Other symptoms and signs involving the musculoskeletal system: Secondary | ICD-10-CM | POA: Diagnosis not present

## 2014-02-28 DIAGNOSIS — M545 Low back pain, unspecified: Secondary | ICD-10-CM | POA: Diagnosis not present

## 2014-02-28 DIAGNOSIS — IMO0001 Reserved for inherently not codable concepts without codable children: Secondary | ICD-10-CM | POA: Diagnosis not present

## 2014-03-04 ENCOUNTER — Other Ambulatory Visit: Payer: Self-pay | Admitting: Family Medicine

## 2014-03-06 ENCOUNTER — Other Ambulatory Visit: Payer: Self-pay

## 2014-03-06 MED ORDER — SIMVASTATIN 40 MG PO TABS: ORAL_TABLET | ORAL | Status: DC

## 2014-03-06 NOTE — Telephone Encounter (Signed)
Rx request for Zocor. Labs are current Rx sent for 6 mos    KP

## 2014-03-19 ENCOUNTER — Other Ambulatory Visit: Payer: Self-pay | Admitting: Family Medicine

## 2014-04-05 ENCOUNTER — Telehealth: Payer: Self-pay | Admitting: Family Medicine

## 2014-04-05 NOTE — Telephone Encounter (Signed)
Caller name: Relation to pt: Call back number:[phone] Pharmacy:  Reason for call:  Daughter would like to get pt off of the RX pioglitazone (ACTOS) 45 MG and onto something else.  Daughter requested to speak with Maudie Mercury.

## 2014-04-05 NOTE — Telephone Encounter (Signed)
Janumet is expensive with the patient's plan.     KP

## 2014-04-05 NOTE — Telephone Encounter (Signed)
Spoke with Diane and she would like for her mother to come off of the Actos due to consistent UTI's, Nausea and vomiting on the medication. She said she is concerned bout her developing bladder cancer due to the med's. Mother has had uterine cancer in the past and daughter would rather for her to just be off of Actos. Please advise      KP

## 2014-04-05 NOTE — Telephone Encounter (Signed)
i need to see formulary

## 2014-04-05 NOTE — Telephone Encounter (Signed)
Stop actos---- janumet 100/1000  #30   1 po qd , 2 refills

## 2014-04-08 NOTE — Telephone Encounter (Signed)
Spoke with daughter and she will give the insurance company a call to get a formulary and bring it to the office.     KP

## 2014-04-16 DIAGNOSIS — H16219 Exposure keratoconjunctivitis, unspecified eye: Secondary | ICD-10-CM | POA: Diagnosis not present

## 2014-05-30 DIAGNOSIS — M47817 Spondylosis without myelopathy or radiculopathy, lumbosacral region: Secondary | ICD-10-CM | POA: Diagnosis not present

## 2014-05-30 DIAGNOSIS — M4000 Postural kyphosis, site unspecified: Secondary | ICD-10-CM | POA: Diagnosis not present

## 2014-06-04 DIAGNOSIS — IMO0001 Reserved for inherently not codable concepts without codable children: Secondary | ICD-10-CM | POA: Diagnosis not present

## 2014-06-04 DIAGNOSIS — M545 Low back pain, unspecified: Secondary | ICD-10-CM | POA: Diagnosis not present

## 2014-06-04 DIAGNOSIS — M6281 Muscle weakness (generalized): Secondary | ICD-10-CM | POA: Diagnosis not present

## 2014-06-04 DIAGNOSIS — R262 Difficulty in walking, not elsewhere classified: Secondary | ICD-10-CM | POA: Diagnosis not present

## 2014-06-06 ENCOUNTER — Ambulatory Visit (INDEPENDENT_AMBULATORY_CARE_PROVIDER_SITE_OTHER): Payer: Medicare Other | Admitting: Family Medicine

## 2014-06-06 ENCOUNTER — Encounter: Payer: Self-pay | Admitting: Family Medicine

## 2014-06-06 VITALS — BP 112/68 | HR 81 | Temp 98.2°F | Wt 157.0 lb

## 2014-06-06 DIAGNOSIS — K219 Gastro-esophageal reflux disease without esophagitis: Secondary | ICD-10-CM | POA: Diagnosis not present

## 2014-06-06 DIAGNOSIS — E785 Hyperlipidemia, unspecified: Secondary | ICD-10-CM | POA: Diagnosis not present

## 2014-06-06 DIAGNOSIS — G4700 Insomnia, unspecified: Secondary | ICD-10-CM

## 2014-06-06 DIAGNOSIS — E1159 Type 2 diabetes mellitus with other circulatory complications: Secondary | ICD-10-CM

## 2014-06-06 DIAGNOSIS — I10 Essential (primary) hypertension: Secondary | ICD-10-CM

## 2014-06-06 DIAGNOSIS — Z23 Encounter for immunization: Secondary | ICD-10-CM

## 2014-06-06 DIAGNOSIS — R82998 Other abnormal findings in urine: Secondary | ICD-10-CM

## 2014-06-06 DIAGNOSIS — R8290 Unspecified abnormal findings in urine: Secondary | ICD-10-CM

## 2014-06-06 MED ORDER — TRAZODONE HCL 50 MG PO TABS: 100.0000 mg | ORAL_TABLET | Freq: Every day | ORAL | Status: DC

## 2014-06-06 MED ORDER — PIOGLITAZONE HCL 45 MG PO TABS: ORAL_TABLET | ORAL | Status: DC

## 2014-06-06 MED ORDER — PANTOPRAZOLE SODIUM 40 MG PO TBEC: 40.0000 mg | DELAYED_RELEASE_TABLET | Freq: Every day | ORAL | Status: DC

## 2014-06-06 MED ORDER — ENALAPRIL MALEATE 20 MG PO TABS: ORAL_TABLET | ORAL | Status: DC

## 2014-06-06 MED ORDER — METFORMIN HCL ER 500 MG PO TB24: ORAL_TABLET | ORAL | Status: DC

## 2014-06-06 MED ORDER — HYDROCHLOROTHIAZIDE 50 MG PO TABS: 50.0000 mg | ORAL_TABLET | Freq: Every day | ORAL | Status: DC

## 2014-06-06 MED ORDER — SIMVASTATIN 40 MG PO TABS: ORAL_TABLET | ORAL | Status: DC

## 2014-06-06 NOTE — Addendum Note (Signed)
Addended by: Ewing Schlein on: 06/06/2014 11:49 AM   Modules accepted: Orders

## 2014-06-06 NOTE — Progress Notes (Signed)
Subjective:    Patient ID: Janice Norman, female    DOB: 21-Apr-1933, 80 y.o.   MRN: [id number]  Diabetes Pertinent negatives for hypoglycemia include no dizziness or headaches. Pertinent negatives for diabetes include no chest pain, no fatigue, no polydipsia, no polyphagia and no polyuria.    HPI HYPERTENSION  Blood pressure range-not checking  Chest pain- no      Dyspnea- no Lightheadedness- no   Edema- no Other side effects - no   Medication compliance: good Low salt diet- yes  DIABETES  Blood Sugar ranges-134-217  Polyuria- no New Visual problems- no Hypoglycemic symptoms- no Other side effects-no Medication compliance - good Last eye exam- 04/2014 Foot exam- today  HYPERLIPIDEMIA  Medication compliance- no RUQ pain- no  Muscle aches- no Other side effects-no  Review of Systems  Constitutional: Negative for diaphoresis, appetite change, fatigue and unexpected weight change.  Eyes: Negative for pain, redness and visual disturbance.  Respiratory: Negative for cough, chest tightness, shortness of breath and wheezing.   Cardiovascular: Negative for chest pain, palpitations and leg swelling.  Endocrine: Negative for cold intolerance, heat intolerance, polydipsia, polyphagia and polyuria.  Genitourinary: Negative for dysuria, frequency and difficulty urinating.  Neurological: Negative for dizziness, light-headedness, numbness and headaches.   Past Medical History  Diagnosis Date  . Glaucoma   . Hypertension   . Diabetes mellitus without complication   . Endometrial cancer 05/19/2009    TAHysterectomy  . Uterine cancer   . Adenomatous polyp of Janice 2014  . Diverticulosis   . Anemia   . Arthritis   . IBS (irritable bowel syndrome)   . HLD (hyperlipidemia)    History   Social History  . Marital Status: Widowed    Spouse Name: N/A    Number of Children: 3  . Years of Education: N/A   Occupational History  . retired    Social History Main Topics  .  Smoking status: Never Smoker   . Smokeless tobacco: Never Used  . Alcohol Use: Yes     Comment: wine occasional  . Drug Use: No  . Sexual Activity: Not on file   Other Topics Concern  . Not on file   Social History Narrative  . No narrative on file   Current Outpatient Prescriptions  Medication Sig Dispense Refill  . enalapril (VASOTEC) 20 MG tablet TAKE ONE (1) TABLET EACH DAY  30 tablet  5  . hydrochlorothiazide (HYDRODIURIL) 50 MG tablet Take 1 tablet (50 mg total) by mouth daily.  90 tablet  3  . metFORMIN (GLUCOPHAGE-XR) 500 MG 24 hr tablet TAKE TWO TABLETS BY MOUTH EVERY NIGHT AT BEDTIME  60 tablet  2  . pantoprazole (PROTONIX) 40 MG tablet Take 1 tablet (40 mg total) by mouth daily.  90 tablet  3  . pioglitazone (ACTOS) 45 MG tablet TAKE ONE (1) TABLET EACH DAY  30 tablet  2  . simvastatin (ZOCOR) 40 MG tablet 1 tab by mouth daily  30 tablet  5  . traZODone (DESYREL) 50 MG tablet Take 2 tablets (100 mg total) by mouth at bedtime.  180 tablet  3   No current facility-administered medications for this visit.        Objective:   Physical Exam  Constitutional: She is oriented to person, place, and time. She appears well-developed and well-nourished.  HENT:  Head: Normocephalic and atraumatic.  Eyes: Conjunctivae and EOM are normal.  Neck: Normal range of motion. Neck supple. No JVD present. Carotid bruit  is not present. No thyromegaly present.  Cardiovascular: Normal rate, regular rhythm and normal heart sounds.   No murmur heard. Pulmonary/Chest: Effort normal and breath sounds normal. No respiratory distress. She has no wheezes. She has no rales. She exhibits no tenderness.  Musculoskeletal: She exhibits no edema.  Neurological: She is alert and oriented to person, place, and time.  Psychiatric: She has a normal mood and affect.  Sensory exam of the foot is normal, tested with the monofilament. Good pulses, no lesions or ulcers, good peripheral pulses.           Assessment & Plan:  1. Type II or unspecified type diabetes mellitus with peripheral circulatory disorders, uncontrolled(250.72) Check labs - Basic metabolic panel - Hemoglobin A1c - Hepatic function panel - Lipid panel - POCT urinalysis dipstick - metFORMIN (GLUCOPHAGE-XR) 500 MG 24 hr tablet; TAKE TWO TABLETS BY MOUTH EVERY NIGHT AT BEDTIME  Dispense: 60 tablet; Refill: 2 - pioglitazone (ACTOS) 45 MG tablet; TAKE ONE (1) TABLET EACH DAY  Dispense: 30 tablet; Refill: 2  2. Essential hypertension Stable, con't meds - Basic metabolic panel - hydrochlorothiazide (HYDRODIURIL) 50 MG tablet; Take 1 tablet (50 mg total) by mouth daily.  Dispense: 90 tablet; Refill: 3 - enalapril (VASOTEC) 20 MG tablet; TAKE ONE (1) TABLET EACH DAY  Dispense: 30 tablet; Refill: 5  3. Other and unspecified hyperlipidemia Check labs, con't meds - simvastatin (ZOCOR) 40 MG tablet; 1 tab by mouth daily  Dispense: 30 tablet; Refill: 5  4. Gastroesophageal reflux disease without esophagitis Stable, refill meds - pantoprazole (PROTONIX) 40 MG tablet; Take 1 tablet (40 mg total) by mouth daily.  Dispense: 90 tablet; Refill: 3  5. Insomnia  - traZODone (DESYREL) 50 MG tablet; Take 2 tablets (100 mg total) by mouth at bedtime.  Dispense: 180 tablet; Refill: 3

## 2014-06-06 NOTE — Progress Notes (Signed)
Pre visit review using our clinic review tool, if applicable. No additional management support is needed unless otherwise documented below in the visit note. 

## 2014-06-06 NOTE — Patient Instructions (Signed)
Diabetes and Standards of Medical Care  Diabetes is complicated. You may find that your diabetes team includes a dietitian, nurse, diabetes educator, eye doctor, and more. To help everyone know what is going on and to help you get the care you deserve, the following schedule of care was developed to help keep you on track. Below are the tests, exams, vaccines, medicines, education, and plans you will need. HbA1c test This test shows how well you have controlled your glucose over the past 2-3 months. It is used to see if your diabetes management plan needs to be adjusted.   It is performed at least 2 times a year if you are meeting treatment goals.  It is performed 4 times a year if therapy has changed or if you are not meeting treatment goals. Blood pressure test  This test is performed at every routine medical visit. The goal is less than 140/90 mmHg for most people, but 130/80 mmHg in some cases. Ask your health care provider about your goal. Dental exam  Follow up with the dentist regularly. Eye exam  If you are diagnosed with type 1 diabetes as a child, get an exam upon reaching the age of 10 years or older and have had diabetes for 3-5 years. Yearly eye exams are recommended after that initial eye exam.  If you are diagnosed with type 1 diabetes as an adult, get an exam within 5 years of diagnosis and then yearly.  If you are diagnosed with type 2 diabetes, get an exam as soon as possible after the diagnosis and then yearly. Foot care exam  Visual foot exams are performed at every routine medical visit. The exams check for cuts, injuries, or other problems with the feet.  A comprehensive foot exam should be done yearly. This includes visual inspection as well as assessing foot pulses and testing for loss of sensation.  Check your feet nightly for cuts, injuries, or other problems with your feet. Tell your health care provider if anything is not healing. Kidney function test (urine  microalbumin)  This test is performed once a year.  Type 1 diabetes: The first test is performed 5 years after diagnosis.  Type 2 diabetes: The first test is performed at the time of diagnosis.  A serum creatinine and estimated glomerular filtration rate (eGFR) test is done once a year to assess the level of chronic kidney disease (CKD), if present. Lipid profile (cholesterol, HDL, LDL, triglycerides)  Performed every 5 years for most people.  The goal for LDL is less than 100 mg/dL. If you are at high risk, the goal is less than 70 mg/dL.  The goal for HDL is 40 mg/dL-50 mg/dL for men and 50 mg/dL-60 mg/dL for women. An HDL cholesterol of 60 mg/dL or higher gives some protection against heart disease.  The goal for triglycerides is less than 150 mg/dL. Influenza vaccine, pneumococcal vaccine, and hepatitis B vaccine  The influenza vaccine is recommended yearly.  The pneumococcal vaccine is generally given once in a lifetime. However, there are some instances when another vaccination is recommended. Check with your health care provider.  The hepatitis B vaccine is also recommended for adults with diabetes. Diabetes self-management education  Education is recommended at diagnosis and ongoing as needed. Treatment plan  Your treatment plan is reviewed at every medical visit. Document Released: 09/19/2009 Document Revised: 07/25/2013 Document Reviewed: 04/24/2013 [employer] Patient Information 2015 [employer]. This information is not intended to replace advice given to you by your   health care provider. Make sure you discuss any questions you have with your health care provider.

## 2014-06-10 ENCOUNTER — Telehealth: Payer: Self-pay | Admitting: Family Medicine

## 2014-06-10 DIAGNOSIS — IMO0001 Reserved for inherently not codable concepts without codable children: Secondary | ICD-10-CM | POA: Diagnosis not present

## 2014-06-10 DIAGNOSIS — M545 Low back pain, unspecified: Secondary | ICD-10-CM | POA: Diagnosis not present

## 2014-06-10 DIAGNOSIS — M6281 Muscle weakness (generalized): Secondary | ICD-10-CM | POA: Diagnosis not present

## 2014-06-10 DIAGNOSIS — R262 Difficulty in walking, not elsewhere classified: Secondary | ICD-10-CM | POA: Diagnosis not present

## 2014-06-10 NOTE — Telephone Encounter (Signed)
Relevant patient education assigned to patient using Emmi. ° °

## 2014-06-11 ENCOUNTER — Other Ambulatory Visit: Payer: Medicare Other

## 2014-06-11 DIAGNOSIS — R82998 Other abnormal findings in urine: Secondary | ICD-10-CM | POA: Diagnosis not present

## 2014-06-11 LAB — LIPID PANEL
CHOLESTEROL: 154 mg/dL (ref 0–200)
HDL: 41 mg/dL (ref >39.00)
LDL Cholesterol: 90 mg/dL (ref 0–99)
NonHDL: 113
TRIGLYCERIDES: 116 mg/dL (ref 0.0–149.0)
Total CHOL/HDL Ratio: 4
VLDL: 23.2 mg/dL (ref 0.0–40.0)

## 2014-06-11 LAB — HEPATIC FUNCTION PANEL
ALT: 12 U/L (ref 0–35)
AST: 20 U/L (ref 0–37)
Albumin: 3.9 g/dL (ref 3.5–5.2)
Alkaline Phosphatase: 45 U/L (ref 39–117)
Bilirubin, Direct: 0.1 mg/dL (ref 0.0–0.3)
Total Bilirubin: 0.6 mg/dL (ref 0.2–1.2)
Total Protein: 7.2 g/dL (ref 6.0–8.3)

## 2014-06-11 LAB — BASIC METABOLIC PANEL
BUN: 26 mg/dL — ABNORMAL HIGH (ref 6–23)
CALCIUM: 9.6 mg/dL (ref 8.4–10.5)
CO2: 32 mEq/L (ref 19–32)
CREATININE: 0.7 mg/dL (ref 0.4–1.2)
Chloride: 103 mEq/L (ref 96–112)
GFR: 84.44 mL/min (ref >60.00)
Glucose, Bld: 130 mg/dL — ABNORMAL HIGH (ref 70–99)
Potassium: 4 mEq/L (ref 3.5–5.1)
Sodium: 139 mEq/L (ref 135–145)

## 2014-06-11 LAB — POCT URINALYSIS DIPSTICK
Bilirubin, UA: NEGATIVE
Blood, UA: NEGATIVE
Glucose, UA: NEGATIVE
KETONES UA: NEGATIVE
Nitrite, UA: NEGATIVE
Protein, UA: NEGATIVE
Spec Grav, UA: <=1.005
Urobilinogen, UA: 0.2
pH, UA: 7.5

## 2014-06-11 LAB — HEMOGLOBIN A1C: HEMOGLOBIN A1C: 6.6 % — AB (ref 4.6–6.5)

## 2014-06-11 NOTE — Addendum Note (Signed)
Addended by: Modena Morrow D on: 06/11/2014 04:29 PM   Modules accepted: Orders

## 2014-06-12 NOTE — Progress Notes (Signed)
Urine culture currently pending.

## 2014-06-13 DIAGNOSIS — R262 Difficulty in walking, not elsewhere classified: Secondary | ICD-10-CM | POA: Diagnosis not present

## 2014-06-13 DIAGNOSIS — M545 Low back pain, unspecified: Secondary | ICD-10-CM | POA: Diagnosis not present

## 2014-06-13 DIAGNOSIS — IMO0001 Reserved for inherently not codable concepts without codable children: Secondary | ICD-10-CM | POA: Diagnosis not present

## 2014-06-13 DIAGNOSIS — M6281 Muscle weakness (generalized): Secondary | ICD-10-CM | POA: Diagnosis not present

## 2014-06-13 LAB — URINE CULTURE: Colony Count: 60000

## 2014-06-17 DIAGNOSIS — IMO0001 Reserved for inherently not codable concepts without codable children: Secondary | ICD-10-CM | POA: Diagnosis not present

## 2014-06-17 DIAGNOSIS — M6281 Muscle weakness (generalized): Secondary | ICD-10-CM | POA: Diagnosis not present

## 2014-06-17 DIAGNOSIS — R262 Difficulty in walking, not elsewhere classified: Secondary | ICD-10-CM | POA: Diagnosis not present

## 2014-06-17 DIAGNOSIS — M545 Low back pain, unspecified: Secondary | ICD-10-CM | POA: Diagnosis not present

## 2014-06-19 DIAGNOSIS — M6281 Muscle weakness (generalized): Secondary | ICD-10-CM | POA: Diagnosis not present

## 2014-06-19 DIAGNOSIS — M545 Low back pain, unspecified: Secondary | ICD-10-CM | POA: Diagnosis not present

## 2014-06-19 DIAGNOSIS — IMO0001 Reserved for inherently not codable concepts without codable children: Secondary | ICD-10-CM | POA: Diagnosis not present

## 2014-06-19 DIAGNOSIS — R262 Difficulty in walking, not elsewhere classified: Secondary | ICD-10-CM | POA: Diagnosis not present

## 2014-06-28 ENCOUNTER — Other Ambulatory Visit: Payer: Self-pay | Admitting: Family Medicine

## 2014-07-01 DIAGNOSIS — M545 Low back pain, unspecified: Secondary | ICD-10-CM | POA: Diagnosis not present

## 2014-07-01 DIAGNOSIS — R262 Difficulty in walking, not elsewhere classified: Secondary | ICD-10-CM | POA: Diagnosis not present

## 2014-07-01 DIAGNOSIS — IMO0001 Reserved for inherently not codable concepts without codable children: Secondary | ICD-10-CM | POA: Diagnosis not present

## 2014-07-01 DIAGNOSIS — M6281 Muscle weakness (generalized): Secondary | ICD-10-CM | POA: Diagnosis not present

## 2014-07-03 DIAGNOSIS — M545 Low back pain, unspecified: Secondary | ICD-10-CM | POA: Diagnosis not present

## 2014-07-03 DIAGNOSIS — M6281 Muscle weakness (generalized): Secondary | ICD-10-CM | POA: Diagnosis not present

## 2014-07-03 DIAGNOSIS — R262 Difficulty in walking, not elsewhere classified: Secondary | ICD-10-CM | POA: Diagnosis not present

## 2014-07-03 DIAGNOSIS — IMO0001 Reserved for inherently not codable concepts without codable children: Secondary | ICD-10-CM | POA: Diagnosis not present

## 2014-07-08 DIAGNOSIS — M545 Low back pain, unspecified: Secondary | ICD-10-CM | POA: Diagnosis not present

## 2014-07-08 DIAGNOSIS — R262 Difficulty in walking, not elsewhere classified: Secondary | ICD-10-CM | POA: Diagnosis not present

## 2014-07-08 DIAGNOSIS — M6281 Muscle weakness (generalized): Secondary | ICD-10-CM | POA: Diagnosis not present

## 2014-07-08 DIAGNOSIS — IMO0001 Reserved for inherently not codable concepts without codable children: Secondary | ICD-10-CM | POA: Diagnosis not present

## 2014-07-11 DIAGNOSIS — R262 Difficulty in walking, not elsewhere classified: Secondary | ICD-10-CM | POA: Diagnosis not present

## 2014-07-11 DIAGNOSIS — M545 Low back pain, unspecified: Secondary | ICD-10-CM | POA: Diagnosis not present

## 2014-07-11 DIAGNOSIS — IMO0001 Reserved for inherently not codable concepts without codable children: Secondary | ICD-10-CM | POA: Diagnosis not present

## 2014-07-11 DIAGNOSIS — M6281 Muscle weakness (generalized): Secondary | ICD-10-CM | POA: Diagnosis not present

## 2014-08-06 DIAGNOSIS — M545 Low back pain, unspecified: Secondary | ICD-10-CM | POA: Diagnosis not present

## 2014-08-06 DIAGNOSIS — M6281 Muscle weakness (generalized): Secondary | ICD-10-CM | POA: Diagnosis not present

## 2014-08-06 DIAGNOSIS — IMO0001 Reserved for inherently not codable concepts without codable children: Secondary | ICD-10-CM | POA: Diagnosis not present

## 2014-08-06 DIAGNOSIS — R262 Difficulty in walking, not elsewhere classified: Secondary | ICD-10-CM | POA: Diagnosis not present

## 2014-09-23 ENCOUNTER — Other Ambulatory Visit: Payer: Self-pay | Admitting: Family Medicine

## 2014-10-07 ENCOUNTER — Other Ambulatory Visit: Payer: Self-pay | Admitting: Dermatology

## 2014-10-07 DIAGNOSIS — L942 Calcinosis cutis: Secondary | ICD-10-CM | POA: Diagnosis not present

## 2014-10-21 ENCOUNTER — Other Ambulatory Visit: Payer: Self-pay | Admitting: Family Medicine

## 2014-11-22 ENCOUNTER — Other Ambulatory Visit: Payer: Self-pay | Admitting: Dermatology

## 2014-11-22 DIAGNOSIS — L942 Calcinosis cutis: Secondary | ICD-10-CM | POA: Diagnosis not present

## 2014-12-03 DIAGNOSIS — Z6827 Body mass index (BMI) 27.0-27.9, adult: Secondary | ICD-10-CM | POA: Diagnosis not present

## 2014-12-03 DIAGNOSIS — M545 Low back pain: Secondary | ICD-10-CM | POA: Diagnosis not present

## 2014-12-03 DIAGNOSIS — M5431 Sciatica, right side: Secondary | ICD-10-CM | POA: Diagnosis not present

## 2014-12-10 ENCOUNTER — Ambulatory Visit: Payer: Medicare Other | Admitting: Family Medicine

## 2014-12-19 DIAGNOSIS — M5431 Sciatica, right side: Secondary | ICD-10-CM | POA: Diagnosis not present

## 2014-12-19 DIAGNOSIS — Z6827 Body mass index (BMI) 27.0-27.9, adult: Secondary | ICD-10-CM | POA: Diagnosis not present

## 2014-12-19 DIAGNOSIS — M545 Low back pain: Secondary | ICD-10-CM | POA: Diagnosis not present

## 2014-12-20 ENCOUNTER — Other Ambulatory Visit: Payer: Self-pay | Admitting: Orthopedic Surgery

## 2014-12-20 DIAGNOSIS — M545 Low back pain: Secondary | ICD-10-CM

## 2014-12-24 ENCOUNTER — Ambulatory Visit (INDEPENDENT_AMBULATORY_CARE_PROVIDER_SITE_OTHER): Payer: Medicare Other | Admitting: Family Medicine

## 2014-12-24 ENCOUNTER — Encounter: Payer: Self-pay | Admitting: Family Medicine

## 2014-12-24 VITALS — BP 160/70 | HR 68 | Temp 97.8°F | Wt 150.8 lb

## 2014-12-24 DIAGNOSIS — IMO0002 Reserved for concepts with insufficient information to code with codable children: Secondary | ICD-10-CM

## 2014-12-24 DIAGNOSIS — K219 Gastro-esophageal reflux disease without esophagitis: Secondary | ICD-10-CM | POA: Diagnosis not present

## 2014-12-24 DIAGNOSIS — I10 Essential (primary) hypertension: Secondary | ICD-10-CM | POA: Diagnosis not present

## 2014-12-24 DIAGNOSIS — E1165 Type 2 diabetes mellitus with hyperglycemia: Secondary | ICD-10-CM

## 2014-12-24 DIAGNOSIS — M159 Polyosteoarthritis, unspecified: Secondary | ICD-10-CM | POA: Diagnosis not present

## 2014-12-24 DIAGNOSIS — R8290 Unspecified abnormal findings in urine: Secondary | ICD-10-CM | POA: Diagnosis not present

## 2014-12-24 LAB — POCT URINALYSIS DIPSTICK
Bilirubin, UA: NEGATIVE
Blood, UA: NEGATIVE
GLUCOSE UA: NEGATIVE
Ketones, UA: NEGATIVE
Nitrite, UA: NEGATIVE
PROTEIN UA: NEGATIVE
Spec Grav, UA: >=1.03
Urobilinogen, UA: NEGATIVE
pH, UA: 6.5

## 2014-12-24 LAB — BASIC METABOLIC PANEL
BUN: 27 mg/dL — AB (ref 6–23)
CO2: 29 mEq/L (ref 19–32)
Calcium: 10.2 mg/dL (ref 8.4–10.5)
Chloride: 104 mEq/L (ref 96–112)
Creatinine, Ser: 0.84 mg/dL (ref 0.40–1.20)
GFR: 69.45 mL/min (ref >60.00)
Glucose, Bld: 148 mg/dL — ABNORMAL HIGH (ref 70–99)
POTASSIUM: 3.8 meq/L (ref 3.5–5.1)
Sodium: 139 mEq/L (ref 135–145)

## 2014-12-24 LAB — HEPATIC FUNCTION PANEL
ALBUMIN: 4.3 g/dL (ref 3.5–5.2)
ALT: 12 U/L (ref 0–35)
AST: 16 U/L (ref 0–37)
Alkaline Phosphatase: 67 U/L (ref 39–117)
BILIRUBIN DIRECT: 0.1 mg/dL (ref 0.0–0.3)
TOTAL PROTEIN: 8.1 g/dL (ref 6.0–8.3)
Total Bilirubin: 0.5 mg/dL (ref 0.2–1.2)

## 2014-12-24 LAB — LIPID PANEL
CHOLESTEROL: 135 mg/dL (ref 0–200)
HDL: 41.6 mg/dL (ref >39.00)
LDL Cholesterol: 61 mg/dL (ref 0–99)
NONHDL: 93.4
TRIGLYCERIDES: 161 mg/dL — AB (ref 0.0–149.0)
Total CHOL/HDL Ratio: 3
VLDL: 32.2 mg/dL (ref 0.0–40.0)

## 2014-12-24 LAB — MICROALBUMIN / CREATININE URINE RATIO
CREATININE, U: 83.8 mg/dL
MICROALB/CREAT RATIO: 8.1 mg/g (ref 0.0–30.0)
Microalb, Ur: 6.8 mg/dL — ABNORMAL HIGH (ref 0.0–1.9)

## 2014-12-24 LAB — HEMOGLOBIN A1C: Hgb A1c MFr Bld: 7.3 % — ABNORMAL HIGH (ref 4.6–6.5)

## 2014-12-24 MED ORDER — METFORMIN HCL ER 500 MG PO TB24: ORAL_TABLET | ORAL | Status: DC

## 2014-12-24 MED ORDER — PANTOPRAZOLE SODIUM 40 MG PO TBEC: DELAYED_RELEASE_TABLET | ORAL | Status: DC

## 2014-12-24 MED ORDER — PIOGLITAZONE HCL 45 MG PO TABS: ORAL_TABLET | ORAL | Status: DC

## 2014-12-24 MED ORDER — ENALAPRIL MALEATE 20 MG PO TABS: ORAL_TABLET | ORAL | Status: DC

## 2014-12-24 MED ORDER — HYDROCODONE-ACETAMINOPHEN 10-325 MG PO TABS: 1.0000 | ORAL_TABLET | Freq: Three times a day (TID) | ORAL | Status: DC

## 2014-12-24 NOTE — Patient Instructions (Addendum)
Diabetes and Standards of Medical Care Diabetes is complicated. You may find that your diabetes team includes a dietitian, nurse, diabetes educator, eye doctor, and more. To help everyone know what is going on and to help you get the care you deserve, the following schedule of care was developed to help keep you on track. Below are the tests, exams, vaccines, medicines, education, and plans you will need. HbA1c test This test shows how well you have controlled your glucose over the past 2-3 months. It is used to see if your diabetes management plan needs to be adjusted.   It is performed at least 2 times a year if you are meeting treatment goals.  It is performed 4 times a year if therapy has changed or if you are not meeting treatment goals. Blood pressure test  This test is performed at every routine medical visit. The goal is less than 140/90 mm Hg for most people, but 130/80 mm Hg in some cases. Ask your health care provider about your goal. Dental exam  Follow up with the dentist regularly. Eye exam  If you are diagnosed with type 1 diabetes as a child, get an exam upon reaching the age of 37 years or older and have had diabetes for 3-5 years. Yearly eye exams are recommended after that initial eye exam.  If you are diagnosed with type 1 diabetes as an adult, get an exam within 5 years of diagnosis and then yearly.  If you are diagnosed with type 2 diabetes, get an exam as soon as possible after the diagnosis and then yearly. Foot care exam  Visual foot exams are performed at every routine medical visit. The exams check for cuts, injuries, or other problems with the feet.  A comprehensive foot exam should be done yearly. This includes visual inspection as well as assessing foot pulses and testing for loss of sensation.  Check your feet nightly for cuts, injuries, or other problems with your feet. Tell your health care provider if anything is not healing. Kidney function test (urine  microalbumin)  This test is performed once a year.  Type 1 diabetes: The first test is performed 5 years after diagnosis.  Type 2 diabetes: The first test is performed at the time of diagnosis.  A serum creatinine and estimated glomerular filtration rate (eGFR) test is done once a year to assess the level of chronic kidney disease (CKD), if present. Lipid profile (cholesterol, HDL, LDL, triglycerides)  Performed every 5 years for most people.  The goal for LDL is less than 100 mg/dL. If you are at high risk, the goal is less than 70 mg/dL.  The goal for HDL is 40 mg/dL-50 mg/dL for men and 50 mg/dL-60 mg/dL for women. An HDL cholesterol of 60 mg/dL or higher gives some protection against heart disease.  The goal for triglycerides is less than 150 mg/dL. Influenza vaccine, pneumococcal vaccine, and hepatitis B vaccine  The influenza vaccine is recommended yearly.  It is recommended that people with diabetes who are over 24 years old get the pneumonia vaccine. In some cases, two separate shots may be given. Ask your health care provider if your pneumonia vaccination is up to date.  The hepatitis B vaccine is also recommended for adults with diabetes. Diabetes self-management education  Education is recommended at diagnosis and ongoing as needed. Treatment plan  Your treatment plan is reviewed at every medical visit. Document Released: 09/19/2009 Document Revised: 04/08/2014 Document Reviewed: 04/24/2013 [facility] Patient Information 2015 [place],  [employer]. This information is not intended to replace advice given to you by your health care provider. Make sure you discuss any questions you have with your health care provider.  

## 2014-12-24 NOTE — Progress Notes (Signed)
   Subjective:    Patient ID: Janice Norman, female    DOB: Nov 03, 1933, 81 y.o.   MRN: [id number]  HPI  Pt is here for f/u and informs [place] that her daughter passed away at [facility].she is having a hard time with it.  HYPERTENSION  Blood pressure range-does not check   Chest pain- no      Dyspnea- no Lightheadedness- no   Edema- no Other side effects - no   Medication compliance: good Low salt diet- yes  DIABETES  Blood Sugar ranges-130-160  Polyuria- no New Visual problems- no Hypoglycemic symptoms- no Other side effects-no Medication compliance - good Last eye exam- annual Foot exam- today       Review of Systems    as above Objective:   Physical Exam BP 160/70 mmHg  Pulse 68  Temp(Src) 97.8 F (36.6 C) (Oral)  Wt 150 lb 12.8 oz (68.402 kg)  SpO2 95% General appearance: alert, cooperative, appears stated age and no distress Eyes: negative findings: lids and lashes normal and conjunctivae and sclerae normal Ears: normal TM's and external ear canals both ears Nose: Nares normal. Septum midline. Mucosa normal. No drainage or sinus tenderness. Throat: lips, mucosa, and tongue normal; teeth and gums normal Neck: no adenopathy, no carotid bruit, no JVD, supple, symmetrical, trachea midline and thyroid not enlarged, symmetric, no tenderness/mass/nodules Lungs: clear to auscultation bilaterally Heart: regular rate and rhythm, S1, S2 normal, no murmur, click, rub or gallop Extremities: extremities normal, atraumatic, no cyanosis or edema  Sensory exam of the foot is normal, tested with the monofilament. Good pulses, no lesions or ulcers, good peripheral pulses.        Assessment & Plan:  1. Diabetes mellitus type II, uncontrolled Check labs  - Basic metabolic panel - Hemoglobin A1c - Hepatic function panel - Lipid panel - Microalbumin / creatinine urine ratio - POCT urinalysis dipstick - pioglitazone (ACTOS) 45 MG tablet; TAKE ONE (1) TABLET BY MOUTH EVERY  DAY  Dispense: 30 tablet; Refill: 5 - metFORMIN (GLUCOPHAGE-XR) 500 MG 24 hr tablet; TAKE TWO TABLETS BY MOUTH EVERY NIGHT AT BEDTIME  Dispense: 60 tablet; Refill: 2  2. Essential hypertension Slightly high today,  Pt is under a lot of stress --- she went to wrong office at [facility] and her daughter died at [facility]--- liver/ kidney failure - Basic metabolic panel - Hemoglobin A1c - Hepatic function panel - Lipid panel - Microalbumin / creatinine urine ratio - POCT urinalysis dipstick - enalapril (VASOTEC) 20 MG tablet; TAKE ONE (1) TABLET EACH DAY  Dispense: 30 tablet; Refill: 5  3. Gastroesophageal reflux disease without esophagitis  - pantoprazole (PROTONIX) 40 MG tablet; TAKE ONE (1) TABLET EACH DAY  Dispense: 90 tablet; Refill: 1  4. Generalized OA  - HYDROcodone-acetaminophen (NORCO) 10-325 MG per tablet; Take 1 tablet by mouth 3 (three) times daily.  Dispense: 30 tablet; Refill: 0  5. Abnormal urine  - Urine Culture

## 2014-12-24 NOTE — Progress Notes (Signed)
Pre visit review using our clinic review tool, if applicable. No additional management support is needed unless otherwise documented below in the visit note. 

## 2014-12-27 LAB — URINE CULTURE
Colony Count: NO GROWTH
ORGANISM ID, BACTERIA: NO GROWTH

## 2014-12-31 ENCOUNTER — Ambulatory Visit
Admission: RE | Admit: 2014-12-31 | Discharge: 2014-12-31 | Disposition: A | Discharge status: Home / Self Care | Payer: Medicare Other | Source: Ambulatory Visit | Attending: Orthopedic Surgery | Admitting: Orthopedic Surgery

## 2014-12-31 DIAGNOSIS — M5126 Other intervertebral disc displacement, lumbar region: Secondary | ICD-10-CM | POA: Diagnosis not present

## 2014-12-31 DIAGNOSIS — M5136 Other intervertebral disc degeneration, lumbar region: Secondary | ICD-10-CM | POA: Diagnosis not present

## 2014-12-31 DIAGNOSIS — M47816 Spondylosis without myelopathy or radiculopathy, lumbar region: Secondary | ICD-10-CM | POA: Diagnosis not present

## 2014-12-31 DIAGNOSIS — M545 Low back pain: Secondary | ICD-10-CM

## 2014-12-31 DIAGNOSIS — M4856XA Collapsed vertebra, not elsewhere classified, lumbar region, initial encounter for fracture: Secondary | ICD-10-CM | POA: Diagnosis not present

## 2015-01-09 DIAGNOSIS — M8008XA Age-related osteoporosis with current pathological fracture, vertebra(e), initial encounter for fracture: Secondary | ICD-10-CM | POA: Diagnosis not present

## 2015-01-09 DIAGNOSIS — M5431 Sciatica, right side: Secondary | ICD-10-CM | POA: Diagnosis not present

## 2015-01-09 DIAGNOSIS — M545 Low back pain: Secondary | ICD-10-CM | POA: Diagnosis not present

## 2015-01-13 ENCOUNTER — Other Ambulatory Visit: Payer: Self-pay | Admitting: Family Medicine

## 2015-01-13 NOTE — Telephone Encounter (Signed)
Last filled:  12/24/14 Amt: 31, 2  Too soon to refill.

## 2015-01-17 DIAGNOSIS — M8008XD Age-related osteoporosis with current pathological fracture, vertebra(e), subsequent encounter for fracture with routine healing: Secondary | ICD-10-CM | POA: Diagnosis not present

## 2015-01-30 DIAGNOSIS — M545 Low back pain: Secondary | ICD-10-CM | POA: Diagnosis not present

## 2015-01-30 DIAGNOSIS — M5431 Sciatica, right side: Secondary | ICD-10-CM | POA: Diagnosis not present

## 2015-01-30 DIAGNOSIS — M8008XD Age-related osteoporosis with current pathological fracture, vertebra(e), subsequent encounter for fracture with routine healing: Secondary | ICD-10-CM | POA: Diagnosis not present

## 2015-01-30 DIAGNOSIS — Z6827 Body mass index (BMI) 27.0-27.9, adult: Secondary | ICD-10-CM | POA: Diagnosis not present

## 2015-03-06 DIAGNOSIS — M8008XD Age-related osteoporosis with current pathological fracture, vertebra(e), subsequent encounter for fracture with routine healing: Secondary | ICD-10-CM | POA: Diagnosis not present

## 2015-03-06 DIAGNOSIS — M5431 Sciatica, right side: Secondary | ICD-10-CM | POA: Diagnosis not present

## 2015-03-06 DIAGNOSIS — M545 Low back pain: Secondary | ICD-10-CM | POA: Diagnosis not present

## 2015-03-11 ENCOUNTER — Ambulatory Visit: Payer: Medicare Other | Admitting: Family Medicine

## 2015-04-07 ENCOUNTER — Telehealth: Payer: Self-pay | Admitting: Family Medicine

## 2015-04-07 NOTE — Telephone Encounter (Signed)
Patient left voice message stating that she needs to reschedule 04/21/15 appointment. Called and left message for patient to call us back and talk to someone to reschedule

## 2015-04-10 ENCOUNTER — Telehealth: Payer: Self-pay | Admitting: Family Medicine

## 2015-04-10 DIAGNOSIS — E785 Hyperlipidemia, unspecified: Secondary | ICD-10-CM

## 2015-04-10 MED ORDER — SIMVASTATIN 40 MG PO TABS: ORAL_TABLET | ORAL | Status: DC

## 2015-04-10 NOTE — Telephone Encounter (Signed)
Relation to pt: self Call back number: [phone] Pharmacy: [facility] [address], [place], [place] [address] [phone] (new pharmacy)     Reason for call: Pt requesting a refill simvastatin (ZOCOR) 40 MG tablet

## 2015-04-10 NOTE — Telephone Encounter (Signed)
Rx faxed.    KP 

## 2015-04-11 ENCOUNTER — Telehealth: Payer: Self-pay | Admitting: Family Medicine

## 2015-04-11 DIAGNOSIS — E1165 Type 2 diabetes mellitus with hyperglycemia: Secondary | ICD-10-CM

## 2015-04-11 DIAGNOSIS — IMO0002 Reserved for concepts with insufficient information to code with codable children: Secondary | ICD-10-CM

## 2015-04-11 MED ORDER — METFORMIN HCL ER 500 MG PO TB24: ORAL_TABLET | ORAL | Status: DC

## 2015-04-11 NOTE — Telephone Encounter (Signed)
Pt switched pharmacy requesting RX please send to  [id number] - [place], [address] [phone] (Phone)

## 2015-04-11 NOTE — Telephone Encounter (Signed)
Relation to pt: self  Call back [facility], [place] - [address] [phone] (Phone) [phone] (Fax)         Reason for call:  Pt requesting a refill metFORMIN (GLUCOPHAGE-XR) 500 MG 24 hr tablet

## 2015-04-11 NOTE — Telephone Encounter (Signed)
Rx faxed.    KP 

## 2015-04-11 NOTE — Addendum Note (Signed)
Addended by: Ewing Schlein on: 04/11/2015 04:47 PM   Modules accepted: Orders

## 2015-04-21 ENCOUNTER — Ambulatory Visit: Payer: PRIVATE HEALTH INSURANCE | Admitting: Family Medicine

## 2015-04-24 DIAGNOSIS — M545 Low back pain: Secondary | ICD-10-CM | POA: Diagnosis not present

## 2015-04-24 DIAGNOSIS — Z6827 Body mass index (BMI) 27.0-27.9, adult: Secondary | ICD-10-CM | POA: Diagnosis not present

## 2015-04-24 DIAGNOSIS — M8008XD Age-related osteoporosis with current pathological fracture, vertebra(e), subsequent encounter for fracture with routine healing: Secondary | ICD-10-CM | POA: Diagnosis not present

## 2015-04-24 DIAGNOSIS — M5431 Sciatica, right side: Secondary | ICD-10-CM | POA: Diagnosis not present

## 2015-05-02 ENCOUNTER — Ambulatory Visit (INDEPENDENT_AMBULATORY_CARE_PROVIDER_SITE_OTHER): Payer: Medicare Other | Admitting: Family Medicine

## 2015-05-02 ENCOUNTER — Encounter: Payer: Self-pay | Admitting: Family Medicine

## 2015-05-02 VITALS — BP 126/80 | HR 74 | Temp 97.7°F | Resp 18 | Ht 62.0 in | Wt 131.0 lb

## 2015-05-02 DIAGNOSIS — IMO0002 Reserved for concepts with insufficient information to code with codable children: Secondary | ICD-10-CM

## 2015-05-02 DIAGNOSIS — E1165 Type 2 diabetes mellitus with hyperglycemia: Secondary | ICD-10-CM

## 2015-05-02 DIAGNOSIS — E785 Hyperlipidemia, unspecified: Secondary | ICD-10-CM

## 2015-05-02 DIAGNOSIS — F329 Major depressive disorder, single episode, unspecified: Secondary | ICD-10-CM

## 2015-05-02 DIAGNOSIS — K219 Gastro-esophageal reflux disease without esophagitis: Secondary | ICD-10-CM

## 2015-05-02 DIAGNOSIS — F32A Depression, unspecified: Secondary | ICD-10-CM

## 2015-05-02 DIAGNOSIS — I10 Essential (primary) hypertension: Secondary | ICD-10-CM | POA: Diagnosis not present

## 2015-05-02 DIAGNOSIS — E119 Type 2 diabetes mellitus without complications: Secondary | ICD-10-CM | POA: Diagnosis not present

## 2015-05-02 LAB — HEPATIC FUNCTION PANEL
ALK PHOS: 54 U/L (ref 39–117)
ALT: 12 U/L (ref 0–35)
AST: 22 U/L (ref 0–37)
Albumin: 4.1 g/dL (ref 3.5–5.2)
BILIRUBIN DIRECT: 0.1 mg/dL (ref 0.0–0.3)
BILIRUBIN INDIRECT: 0.3 mg/dL (ref 0.2–1.2)
BILIRUBIN TOTAL: 0.4 mg/dL (ref 0.2–1.2)
Total Protein: 7.4 g/dL (ref 6.0–8.3)

## 2015-05-02 LAB — BASIC METABOLIC PANEL
BUN: 30 mg/dL — ABNORMAL HIGH (ref 6–23)
CO2: 26 mEq/L (ref 19–32)
Calcium: 9.4 mg/dL (ref 8.4–10.5)
Chloride: 101 mEq/L (ref 96–112)
Creat: 0.82 mg/dL (ref 0.50–1.10)
Glucose, Bld: 97 mg/dL (ref 70–99)
Potassium: 4 mEq/L (ref 3.5–5.3)
Sodium: 137 mEq/L (ref 135–145)

## 2015-05-02 LAB — LIPID PANEL
Cholesterol: 158 mg/dL (ref 0–200)
HDL: 39 mg/dL — ABNORMAL LOW (ref >=46)
LDL Cholesterol: 85 mg/dL (ref 0–99)
Total CHOL/HDL Ratio: 4.1 Ratio
Triglycerides: 169 mg/dL — ABNORMAL HIGH (ref <150)
VLDL: 34 mg/dL (ref 0–40)

## 2015-05-02 MED ORDER — ENALAPRIL MALEATE 20 MG PO TABS: ORAL_TABLET | ORAL | Status: DC

## 2015-05-02 MED ORDER — HYDROCHLOROTHIAZIDE 50 MG PO TABS: 50.0000 mg | ORAL_TABLET | Freq: Every day | ORAL | Status: DC

## 2015-05-02 MED ORDER — PIOGLITAZONE HCL 45 MG PO TABS: ORAL_TABLET | ORAL | Status: DC

## 2015-05-02 MED ORDER — PANTOPRAZOLE SODIUM 40 MG PO TBEC: DELAYED_RELEASE_TABLET | ORAL | Status: DC

## 2015-05-02 MED ORDER — SIMVASTATIN 40 MG PO TABS: ORAL_TABLET | ORAL | Status: DC

## 2015-05-02 MED ORDER — METFORMIN HCL ER 500 MG PO TB24: ORAL_TABLET | ORAL | Status: DC

## 2015-05-02 NOTE — Patient Instructions (Signed)

## 2015-05-02 NOTE — Progress Notes (Signed)
Pre visit review using our clinic review tool, if applicable. No additional management support is needed unless otherwise documented below in the visit note. 

## 2015-05-03 LAB — HEMOGLOBIN A1C
Hgb A1c MFr Bld: 6.5 % — ABNORMAL HIGH (ref <5.7)
MEAN PLASMA GLUCOSE: 140 mg/dL — AB (ref <117)

## 2015-05-05 DIAGNOSIS — F32A Depression, unspecified: Secondary | ICD-10-CM | POA: Insufficient documentation

## 2015-05-05 DIAGNOSIS — F329 Major depressive disorder, single episode, unspecified: Secondary | ICD-10-CM | POA: Insufficient documentation

## 2015-05-05 NOTE — Assessment & Plan Note (Signed)
Stable con't cymbalta 

## 2015-05-05 NOTE — Progress Notes (Signed)
Patient ID: Janice Norman, female    DOB: 01-23-1933  Age: 81 y.o. MRN: [id number]    Subjective:  Subjective HPI Janice Norman presents for f/u htn, bp and dm and depression / anxiety.    Review of Systems  Constitutional: Negative for diaphoresis, activity change, appetite change, fatigue and unexpected weight change.  Eyes: Negative for pain, redness and visual disturbance.  Respiratory: Negative for cough, chest tightness, shortness of breath and wheezing.   Cardiovascular: Negative for chest pain, palpitations and leg swelling.  Endocrine: Negative for cold intolerance, heat intolerance, polydipsia, polyphagia and polyuria.  Genitourinary: Negative for dysuria, frequency and difficulty urinating.  Neurological: Negative for dizziness, light-headedness, numbness and headaches.  Psychiatric/Behavioral: Negative for suicidal ideas, behavioral problems, sleep disturbance, dysphoric mood and agitation. The patient is not nervous/anxious.     History Past Medical History  Diagnosis Date  . Glaucoma   . Hypertension   . Diabetes mellitus without complication   . Endometrial cancer 05/19/2009    TAHysterectomy  . Uterine cancer   . Adenomatous polyp of colon 2014  . Diverticulosis   . Anemia   . Arthritis   . IBS (irritable bowel syndrome)   . HLD (hyperlipidemia)     She has past surgical history that includes Abdominal hysterectomy (2010); Tumor removal (Left, 1980); and Tonsillectomy.   Her family history includes Brain cancer in her father; Diabetes in her father and sister; Heart disease in her father; Hypertension in her father and sister; Stomach cancer in her mother. There is no history of Colon cancer.She reports that she has never smoked. She has never used smokeless tobacco. She reports that she drinks alcohol. She reports that she does not use illicit drugs.  Current Outpatient Prescriptions on File Prior to Visit  Medication Sig Dispense Refill  . DULoxetine  (CYMBALTA) 30 MG capsule Take 1 capsule by mouth at bedtime.    . gabapentin (NEURONTIN) 300 MG capsule Take 1 capsule by mouth at bedtime.    Marland Kitchen HYDROcodone-acetaminophen (NORCO) 10-325 MG per tablet Take 1 tablet by mouth 3 (three) times daily. 30 tablet 0  . meloxicam (MOBIC) 15 MG tablet Take 1 tablet by mouth daily.    . TRAVATAN Z 0.004 % SOLN ophthalmic solution Place 1 drop into both eyes at bedtime.     No current facility-administered medications on file prior to visit.     Objective:  Objective Physical Exam  Constitutional: She is oriented to person, place, and time. She appears well-developed and well-nourished.  HENT:  Head: Normocephalic and atraumatic.  Eyes: Conjunctivae and EOM are normal.  Neck: Normal range of motion. Neck supple. No JVD present. Carotid bruit is not present. No thyromegaly present.  Cardiovascular: Normal rate, regular rhythm and normal heart sounds.   No murmur heard. Pulmonary/Chest: Effort normal and breath sounds normal. No respiratory distress. She has no wheezes. She has no rales. She exhibits no tenderness.  Musculoskeletal: She exhibits no edema.  Neurological: She is alert and oriented to person, place, and time.  Psychiatric: She has a normal mood and affect. Her behavior is normal. Judgment and thought content normal.   BP 126/80 mmHg  Pulse 74  Temp(Src) 97.7 F (36.5 C) (Oral)  Resp 18  Ht 5\' 2"  (1.575 m)  Wt 131 lb (59.421 kg)  BMI 23.95 kg/m2  SpO2 98% Wt Readings from Last 3 Encounters:  05/02/15 131 lb (59.421 kg)  12/24/14 150 lb 12.8 oz (68.402 kg)  06/06/14 157 lb (71.215  kg)     Lab Results  Component Value Date   GLUCOSE 97 05/02/2015   CHOL 158 05/02/2015   TRIG 169* 05/02/2015   HDL 39* 05/02/2015   LDLCALC 85 05/02/2015   ALT 12 05/02/2015   AST 22 05/02/2015   NA 137 05/02/2015   K 4.0 05/02/2015   CL 101 05/02/2015   CREATININE 0.82 05/02/2015   BUN 30* 05/02/2015   CO2 26 05/02/2015   HGBA1C 6.5*  05/02/2015   MICROALBUR 6.8* 12/24/2014    Mr Lumbar Spine Wo Contrast  12/31/2014   CLINICAL DATA:  Low back pain for 2 years, worsening since December of 2015. Bilateral leg weakness. Sciatica.  EXAM: MRI LUMBAR SPINE WITHOUT CONTRAST  TECHNIQUE: Multiplanar, multisequence MR imaging of the lumbar spine was performed. No intravenous contrast was administered.  COMPARISON:  None.  FINDINGS: The lowest lumbar type non-rib-bearing vertebra is labeled as L5. The conus medullaris appears normal. Conus level: L1.  Disc desiccation noted at all levels between L1 and L5.  Acute or subacute 40% compression fracture of the L5 vertebra with extensive vertebral body edema tracking back into the pedicles.  Prominent posterior paraspinal muscular atrophy.  Additional findings at individual levels are as follows:  L1-2: Mild displacement of the right L1 nerve in the lateral extraforaminal space due to disc bulge.  L2-3: No impingement. Diffuse disc bulge with left foraminal and lateral extraforaminal disc protrusion. Bilateral facet arthropathy, left greater than right.  L3-4: Mild bilateral subarticular lateral recess stenosis with borderline bilateral foraminal stenosis due to facet arthropathy and diffuse disc bulge.  L4-5: Moderate left and mild right subarticular lateral recess stenosis with mild central narrowing of the thecal sac and borderline bilateral foraminal stenosis due to disc bulge, facet arthropathy, and a focal cephalad extending extradural process likely representing disc extrusion on image 4 of series 5, less likely to be a small epidural collection of blood products related to the L5 compression fracture.  L5-S1: Borderline bilateral foraminal stenosis due to disc bulge and facet arthropathy.  IMPRESSION: 1. Acute or subacute 40% superior compression fracture at L5, most likely benign. 2. Lumbar spondylosis and degenerative disc disease cause moderate impingement at L4-5 and mild impingement at L1- 2  and L3-4, as detailed above. Of note, there is a disc extrusion or small epidural collection of blood products extending cephalad from the L4-5 level in the left paracentral position, contributing to the subarticular lateral recess stenosis at L4-5. 3. Prominent posterior paraspinal muscular atrophy throughout the lumbar spine.   Electronically Signed   By: Sherryl Barters M.D.   On: 12/31/2014 15:33     Assessment & Plan:  Plan I am having Ms. Rollen Sox maintain her gabapentin, meloxicam, TRAVATAN Z, DULoxetine, HYDROcodone-acetaminophen, traMADol, enalapril, hydrochlorothiazide, metFORMIN, pantoprazole, pioglitazone, and simvastatin.  Meds ordered this encounter  Medications  . traMADol (ULTRAM) 50 MG tablet    Sig:     Refill:  2  . enalapril (VASOTEC) 20 MG tablet    Sig: TAKE ONE (1) TABLET EACH DAY    Dispense:  30 tablet    Refill:  5    DISCONTINUE ALL PREVIOUS REFILLS FOR THIS MEDICATION  . hydrochlorothiazide (HYDRODIURIL) 50 MG tablet    Sig: Take 1 tablet (50 mg total) by mouth daily.    Dispense:  90 tablet    Refill:  3    DISCONTINUE ALL PREVIOUS REFILLS FOR THIS MEDICATION  . metFORMIN (GLUCOPHAGE-XR) 500 MG 24 hr tablet  Sig: TAKE TWO TABLETS BY MOUTH EVERY NIGHT AT BEDTIME    Dispense:  60 tablet    Refill:  5    DISCONTINUE ALL PREVIOUS REFILLS FOR THIS MEDICATION  . pantoprazole (PROTONIX) 40 MG tablet    Sig: TAKE ONE (1) TABLET EACH DAY    Dispense:  90 tablet    Refill:  1    DISCONTINUE ALL PREVIOUS REFILLS FOR THIS MEDICATION  . pioglitazone (ACTOS) 45 MG tablet    Sig: TAKE ONE (1) TABLET BY MOUTH EVERY DAY    Dispense:  30 tablet    Refill:  5    DISCONTINUE ALL PREVIOUS REFILLS FOR THIS MEDICATION  . simvastatin (ZOCOR) 40 MG tablet    Sig: 1 tab by mouth daily    Dispense:  30 tablet    Refill:  5    DISCONTINUE ALL PREVIOUS REFILLS FOR THIS MEDICATION    Problem List Items Addressed This Visit    HTN (hypertension) - Primary   Relevant  Medications   enalapril (VASOTEC) 20 MG tablet   hydrochlorothiazide (HYDRODIURIL) 50 MG tablet   simvastatin (ZOCOR) 40 MG tablet   Other Relevant Orders   Lipid panel (Completed)   Hepatic function panel (Completed)   Hemoglobin A1c (Completed)   Basic Metabolic Panel (BMET) (Completed)   Depression    Stable con't cymbalta       Other Visit Diagnoses    Diabetes mellitus type II, uncontrolled        Relevant Medications    enalapril (VASOTEC) 20 MG tablet    metFORMIN (GLUCOPHAGE-XR) 500 MG 24 hr tablet    pioglitazone (ACTOS) 45 MG tablet    simvastatin (ZOCOR) 40 MG tablet    Other Relevant Orders    Lipid panel (Completed)    Hepatic function panel (Completed)    Hemoglobin A1c (Completed)    Basic Metabolic Panel (BMET) (Completed)    Gastroesophageal reflux disease without esophagitis        Relevant Medications    pantoprazole (PROTONIX) 40 MG tablet    Other Relevant Orders    Lipid panel (Completed)    Hepatic function panel (Completed)    Hemoglobin A1c (Completed)    Basic Metabolic Panel (BMET) (Completed)    Hyperlipidemia        Relevant Medications    enalapril (VASOTEC) 20 MG tablet    hydrochlorothiazide (HYDRODIURIL) 50 MG tablet    simvastatin (ZOCOR) 40 MG tablet    Other Relevant Orders    Lipid panel (Completed)    Hepatic function panel (Completed)    Hemoglobin A1c (Completed)    Basic Metabolic Panel (BMET) (Completed)       Follow-up: Return in about 6 months (around 11/02/2015), or if symptoms worsen or fail to improve, for htn, hyperlipidemia, DM, fasting, lipid, hep bmp hgba1c, ua, microalbumin, annual exam.  Garnet Koyanagi, DO

## 2015-06-16 ENCOUNTER — Other Ambulatory Visit: Payer: Self-pay

## 2015-06-16 DIAGNOSIS — I10 Essential (primary) hypertension: Secondary | ICD-10-CM

## 2015-06-16 DIAGNOSIS — IMO0002 Reserved for concepts with insufficient information to code with codable children: Secondary | ICD-10-CM

## 2015-06-16 DIAGNOSIS — E785 Hyperlipidemia, unspecified: Secondary | ICD-10-CM

## 2015-06-16 DIAGNOSIS — E1165 Type 2 diabetes mellitus with hyperglycemia: Secondary | ICD-10-CM

## 2015-06-16 MED ORDER — METFORMIN HCL ER 500 MG PO TB24: ORAL_TABLET | ORAL | Status: DC

## 2015-06-16 MED ORDER — ENALAPRIL MALEATE 20 MG PO TABS: ORAL_TABLET | ORAL | Status: DC

## 2015-06-16 MED ORDER — SIMVASTATIN 40 MG PO TABS: ORAL_TABLET | ORAL | Status: DC

## 2015-06-20 ENCOUNTER — Other Ambulatory Visit: Payer: Self-pay

## 2015-06-20 DIAGNOSIS — K219 Gastro-esophageal reflux disease without esophagitis: Secondary | ICD-10-CM

## 2015-06-20 DIAGNOSIS — E1165 Type 2 diabetes mellitus with hyperglycemia: Secondary | ICD-10-CM

## 2015-06-20 DIAGNOSIS — IMO0002 Reserved for concepts with insufficient information to code with codable children: Secondary | ICD-10-CM

## 2015-06-20 MED ORDER — PIOGLITAZONE HCL 45 MG PO TABS: ORAL_TABLET | ORAL | Status: DC

## 2015-06-20 MED ORDER — PANTOPRAZOLE SODIUM 40 MG PO TBEC: DELAYED_RELEASE_TABLET | ORAL | Status: DC

## 2015-07-10 DIAGNOSIS — E119 Type 2 diabetes mellitus without complications: Secondary | ICD-10-CM | POA: Diagnosis not present

## 2015-07-10 DIAGNOSIS — H524 Presbyopia: Secondary | ICD-10-CM | POA: Diagnosis not present

## 2015-07-25 ENCOUNTER — Other Ambulatory Visit: Payer: Self-pay

## 2015-07-25 DIAGNOSIS — I10 Essential (primary) hypertension: Secondary | ICD-10-CM

## 2015-07-25 MED ORDER — HYDROCHLOROTHIAZIDE 50 MG PO TABS: 50.0000 mg | ORAL_TABLET | Freq: Every day | ORAL | Status: DC

## 2015-08-07 ENCOUNTER — Ambulatory Visit (INDEPENDENT_AMBULATORY_CARE_PROVIDER_SITE_OTHER): Payer: Medicare Other | Admitting: Family Medicine

## 2015-08-07 ENCOUNTER — Telehealth: Payer: Self-pay | Admitting: Family Medicine

## 2015-08-07 ENCOUNTER — Encounter: Payer: Self-pay | Admitting: Family Medicine

## 2015-08-07 VITALS — BP 116/68 | HR 51 | Temp 98.5°F | Wt 136.4 lb

## 2015-08-07 DIAGNOSIS — R319 Hematuria, unspecified: Secondary | ICD-10-CM

## 2015-08-07 DIAGNOSIS — K529 Noninfective gastroenteritis and colitis, unspecified: Secondary | ICD-10-CM | POA: Diagnosis not present

## 2015-08-07 DIAGNOSIS — K5289 Other specified noninfective gastroenteritis and colitis: Secondary | ICD-10-CM | POA: Diagnosis not present

## 2015-08-07 DIAGNOSIS — R82998 Other abnormal findings in urine: Secondary | ICD-10-CM

## 2015-08-07 DIAGNOSIS — N390 Urinary tract infection, site not specified: Secondary | ICD-10-CM

## 2015-08-07 NOTE — Telephone Encounter (Signed)
Appointment noted.  Routed to provider for [place].

## 2015-08-07 NOTE — Telephone Encounter (Signed)
Patient Name: Janice Norman DOB: [birth year] Initial Comment Caller States she is vomiting, little diarrhea this morning, feeling very weak. has appt. today for 4pm Nurse Assessment Nurse: Marcelline Deist, RN, Kermit Balo Date/Time (Eastern Time): 08/07/2015 11:50:31 AM Confirm and document reason for call. If symptomatic, describe symptoms. ---Caller states she is vomiting, little diarrhea this morning, is feeling very weak. Has an appt. today for 4pm. No fever, has had chills. Can keep fluid down. Has the patient traveled out of the country within the last 30 days? ---Not Applicable Does the patient require triage? ---Yes Related visit to physician within the last 2 weeks? ---No Does the PT have any chronic conditions? (i.e. diabetes, asthma, etc.) ---Yes List chronic conditions. ---diabetes, BP rx Guidelines Guideline Title Affirmed Question Affirmed Notes Vomiting [1] Constant abdominal pain AND [2] present > 2 hours Final Disposition User See Physician within 4 Hours (or PCP triage) Marcelline Deist, RN, Lynda Comments Caller states she has not vomited today, but did so multiple times last night. Her stomach is sore, but she thinks that is because of all the vomiting. Feels weak, trying to take small sips of tea. Referrals REFERRED TO PCP OFFICE Disagree/Comply: Comply

## 2015-08-07 NOTE — Progress Notes (Signed)
Patient ID: Janice Norman, female    DOB: [birth year]  Age: 81 y.o. MRN: [id number]    Subjective:  Subjective HPI Janice Norman presents for nvd x 1 day.  Vomiting and diarrhea stopped as of this am.  She has had a little more to drink today but has been unable to eat----she had 1/2 piece dry toast and then just could not eat the rest.      Review of Systems  Constitutional: Negative for diaphoresis, appetite change, fatigue and unexpected weight change.  Eyes: Negative for pain, redness and visual disturbance.  Respiratory: Negative for cough, chest tightness, shortness of breath and wheezing.   Cardiovascular: Negative for chest pain, palpitations and leg swelling.  Endocrine: Negative for cold intolerance, heat intolerance, polydipsia, polyphagia and polyuria.  Genitourinary: Negative for dysuria, frequency and difficulty urinating.  Neurological: Negative for dizziness, light-headedness, numbness and headaches.    History Past Medical History  Diagnosis Date  . Glaucoma   . Hypertension   . Diabetes mellitus without complication   . Endometrial cancer 05/19/2009    TAHysterectomy  . Uterine cancer   . Adenomatous polyp of colon 2014  . Diverticulosis   . Anemia   . Arthritis   . IBS (irritable bowel syndrome)   . HLD (hyperlipidemia)     She has past surgical history that includes Abdominal hysterectomy (2010); Tumor removal (Left, 1980); and Tonsillectomy.   Her family history includes Brain cancer in her father; Diabetes in her father and sister; Heart disease in her father; Hypertension in her father and sister; Stomach cancer in her mother. There is no history of Colon cancer.She reports that she has never smoked. She has never used smokeless tobacco. She reports that she drinks alcohol. She reports that she does not use illicit drugs.  Current Outpatient Prescriptions on File Prior to Visit  Medication Sig Dispense Refill  . DULoxetine (CYMBALTA) 30 MG capsule  Take 1 capsule by mouth at bedtime.    . enalapril (VASOTEC) 20 MG tablet TAKE ONE (1) TABLET EACH DAY 90 tablet 1  . gabapentin (NEURONTIN) 300 MG capsule Take 1 capsule by mouth at bedtime.    . hydrochlorothiazide (HYDRODIURIL) 50 MG tablet Take 1 tablet (50 mg total) by mouth daily. 90 tablet 3  . meloxicam (MOBIC) 15 MG tablet Take 1 tablet by mouth daily.    . metFORMIN (GLUCOPHAGE-XR) 500 MG 24 hr tablet TAKE TWO TABLETS BY MOUTH EVERY NIGHT AT BEDTIME 180 tablet 1  . pantoprazole (PROTONIX) 40 MG tablet TAKE ONE (1) TABLET EACH DAY 90 tablet 3  . pioglitazone (ACTOS) 45 MG tablet TAKE ONE (1) TABLET BY MOUTH EVERY DAY 90 tablet 1  . simvastatin (ZOCOR) 40 MG tablet 1 tab by mouth daily 90 tablet 1  . traMADol (ULTRAM) 50 MG tablet   2  . TRAVATAN Z 0.004 % SOLN ophthalmic solution Place 1 drop into both eyes at bedtime.     No current facility-administered medications on file prior to visit.     Objective:  Objective Physical Exam  Constitutional: She is oriented to person, place, and time. She appears well-developed and well-nourished.  HENT:  Head: Normocephalic and atraumatic.  Eyes: Conjunctivae and EOM are normal.  Neck: Normal range of motion. Neck supple. No JVD present. Carotid bruit is not present. No thyromegaly present.  Cardiovascular: Normal rate and regular rhythm.   Murmur heard. Pulmonary/Chest: Effort normal and breath sounds normal. No respiratory distress. She has no wheezes. She has  no rales. She exhibits no tenderness.  Musculoskeletal: She exhibits no edema.  Neurological: She is alert and oriented to person, place, and time.  Psychiatric: She has a normal mood and affect. Her behavior is normal.   BP 116/68 mmHg  Pulse 51  Temp(Src) 98.5 F (36.9 C) (Oral)  Wt 136 lb 6.4 oz (61.871 kg)  SpO2 96% Wt Readings from Last 3 Encounters:  08/07/15 136 lb 6.4 oz (61.871 kg)  05/02/15 131 lb (59.421 kg)  12/24/14 150 lb 12.8 oz (68.402 kg)     Lab  Results  Component Value Date   GLUCOSE 97 05/02/2015   CHOL 158 05/02/2015   TRIG 169* 05/02/2015   HDL 39* 05/02/2015   LDLCALC 85 05/02/2015   ALT 12 05/02/2015   AST 22 05/02/2015   NA 137 05/02/2015   K 4.0 05/02/2015   CL 101 05/02/2015   CREATININE 0.82 05/02/2015   BUN 30* 05/02/2015   CO2 26 05/02/2015   HGBA1C 6.5* 05/02/2015   MICROALBUR 6.8* 12/24/2014    Mr Lumbar Spine Wo Contrast  12/31/2014   CLINICAL DATA:  Low back pain for 2 years, worsening since December of 2015. Bilateral leg weakness. Sciatica.  EXAM: MRI LUMBAR SPINE WITHOUT CONTRAST  TECHNIQUE: Multiplanar, multisequence MR imaging of the lumbar spine was performed. No intravenous contrast was administered.  COMPARISON:  None.  FINDINGS: The lowest lumbar type non-rib-bearing vertebra is labeled as L5. The conus medullaris appears normal. Conus level: L1.  Disc desiccation noted at all levels between L1 and L5.  Acute or subacute 40% compression fracture of the L5 vertebra with extensive vertebral body edema tracking back into the pedicles.  Prominent posterior paraspinal muscular atrophy.  Additional findings at individual levels are as follows:  L1-2: Mild displacement of the right L1 nerve in the lateral extraforaminal space due to disc bulge.  L2-3: No impingement. Diffuse disc bulge with left foraminal and lateral extraforaminal disc protrusion. Bilateral facet arthropathy, left greater than right.  L3-4: Mild bilateral subarticular lateral recess stenosis with borderline bilateral foraminal stenosis due to facet arthropathy and diffuse disc bulge.  L4-5: Moderate left and mild right subarticular lateral recess stenosis with mild central narrowing of the thecal sac and borderline bilateral foraminal stenosis due to disc bulge, facet arthropathy, and a focal cephalad extending extradural process likely representing disc extrusion on image 4 of series 5, less likely to be a small epidural collection of blood products  related to the L5 compression fracture.  L5-S1: Borderline bilateral foraminal stenosis due to disc bulge and facet arthropathy.  IMPRESSION: 1. Acute or subacute 40% superior compression fracture at L5, most likely benign. 2. Lumbar spondylosis and degenerative disc disease cause moderate impingement at L4-5 and mild impingement at L1- 2 and L3-4, as detailed above. Of note, there is a disc extrusion or small epidural collection of blood products extending cephalad from the L4-5 level in the left paracentral position, contributing to the subarticular lateral recess stenosis at L4-5. 3. Prominent posterior paraspinal muscular atrophy throughout the lumbar spine.   Electronically Signed   By: Sherryl Barters M.D.   On: 12/31/2014 15:33     Assessment & Plan:  Plan I have discontinued Janice Norman's HYDROcodone-acetaminophen. I am also having her maintain her gabapentin, meloxicam, TRAVATAN Z, DULoxetine, traMADol, simvastatin, metFORMIN, enalapril, pioglitazone, pantoprazole, and hydrochlorothiazide.  No orders of the defined types were placed in this encounter.    Problem List Items Addressed This Visit    Noninfectious  gastroenteritis and colitis    Inc po fluids Can try to eat crackers/ toast later if she tolerated inc fluids rto prn Or go to er if nvd returns for IVf      Relevant Orders   Comp Met (CMET)   POCT urinalysis dipstick   CBC with Differential/Platelet    Other Visit Diagnoses    Noninfectious gastroenteritis, unspecified    -  Primary    Relevant Orders    Comp Met (CMET)    POCT urinalysis dipstick    CBC with Differential/Platelet       Follow-up: Return if symptoms worsen or fail to improve.  Garnet Koyanagi, DO

## 2015-08-07 NOTE — Assessment & Plan Note (Signed)
Inc po fluids Can try to eat crackers/ toast later if she tolerated inc fluids rto prn Or go to er if nvd returns for IVf

## 2015-08-07 NOTE — Patient Instructions (Signed)

## 2015-08-07 NOTE — Progress Notes (Signed)
Pre visit review using our clinic review tool, if applicable. No additional management support is needed unless otherwise documented below in the visit note. 

## 2015-08-08 DIAGNOSIS — N390 Urinary tract infection, site not specified: Secondary | ICD-10-CM | POA: Diagnosis not present

## 2015-08-08 LAB — COMPREHENSIVE METABOLIC PANEL
ALT: 10 U/L (ref 0–35)
AST: 16 U/L (ref 0–37)
Albumin: 4.4 g/dL (ref 3.5–5.2)
Alkaline Phosphatase: 50 U/L (ref 39–117)
BUN: 43 mg/dL — ABNORMAL HIGH (ref 6–23)
CO2: 28 mEq/L (ref 19–32)
CREATININE: 1.67 mg/dL — AB (ref 0.40–1.20)
Calcium: 10.2 mg/dL (ref 8.4–10.5)
Chloride: 101 mEq/L (ref 96–112)
GFR: 31.37 mL/min — ABNORMAL LOW (ref >60.00)
Glucose, Bld: 129 mg/dL — ABNORMAL HIGH (ref 70–99)
Potassium: 3.9 mEq/L (ref 3.5–5.1)
SODIUM: 139 meq/L (ref 135–145)
Total Bilirubin: 1 mg/dL (ref 0.2–1.2)
Total Protein: 7.9 g/dL (ref 6.0–8.3)

## 2015-08-08 LAB — CBC WITH DIFFERENTIAL/PLATELET
BASOS PCT: 0 % (ref 0.0–3.0)
Basophils Absolute: 0 10*3/uL (ref 0.0–0.1)
EOS PCT: 0.5 % (ref 0.0–5.0)
Eosinophils Absolute: 0 10*3/uL (ref 0.0–0.7)
HCT: 35.5 % — ABNORMAL LOW (ref 36.0–46.0)
HEMOGLOBIN: 11.9 g/dL — AB (ref 12.0–15.0)
Lymphocytes Relative: 6.9 % — ABNORMAL LOW (ref 12.0–46.0)
Lymphs Abs: 0.6 10*3/uL — ABNORMAL LOW (ref 0.7–4.0)
MCHC: 33.4 g/dL (ref 30.0–36.0)
MCV: 91.6 fl (ref 78.0–100.0)
MONOS PCT: 4.1 % (ref 3.0–12.0)
Monocytes Absolute: 0.4 10*3/uL (ref 0.1–1.0)
Neutro Abs: 7.6 10*3/uL (ref 1.4–7.7)
Neutrophils Relative %: 88.5 % — ABNORMAL HIGH (ref 43.0–77.0)
Platelets: 330 10*3/uL (ref 150.0–400.0)
RBC: 3.88 Mil/uL (ref 3.87–5.11)
RDW: 14.1 % (ref 11.5–15.5)
WBC: 8.6 10*3/uL (ref 4.0–10.5)

## 2015-08-08 LAB — POCT URINALYSIS DIPSTICK
Glucose, UA: NEGATIVE
SPEC GRAV UA: 1.02
UROBILINOGEN UA: 2
pH, UA: 5

## 2015-08-08 NOTE — Addendum Note (Signed)
Addended by: Tasia Catchings on: 08/08/2015 08:16 AM   Modules accepted: Orders

## 2015-08-09 LAB — URINE CULTURE
Colony Count: NO GROWTH
Organism ID, Bacteria: NO GROWTH

## 2015-08-29 ENCOUNTER — Ambulatory Visit: Payer: PRIVATE HEALTH INSURANCE | Admitting: Family Medicine

## 2015-09-17 ENCOUNTER — Other Ambulatory Visit: Payer: Self-pay | Admitting: Family Medicine

## 2015-09-20 DIAGNOSIS — Z79899 Other long term (current) drug therapy: Secondary | ICD-10-CM | POA: Diagnosis not present

## 2015-09-20 DIAGNOSIS — E785 Hyperlipidemia, unspecified: Secondary | ICD-10-CM | POA: Diagnosis not present

## 2015-09-20 DIAGNOSIS — E119 Type 2 diabetes mellitus without complications: Secondary | ICD-10-CM | POA: Diagnosis not present

## 2015-09-20 DIAGNOSIS — R42 Dizziness and giddiness: Secondary | ICD-10-CM | POA: Diagnosis not present

## 2015-09-20 DIAGNOSIS — I951 Orthostatic hypotension: Secondary | ICD-10-CM | POA: Diagnosis not present

## 2015-09-20 DIAGNOSIS — R531 Weakness: Secondary | ICD-10-CM | POA: Diagnosis not present

## 2015-09-20 DIAGNOSIS — K219 Gastro-esophageal reflux disease without esophagitis: Secondary | ICD-10-CM | POA: Diagnosis not present

## 2015-09-23 ENCOUNTER — Telehealth: Payer: Self-pay | Admitting: Family Medicine

## 2015-09-23 NOTE — Telephone Encounter (Signed)
Fax being sent for lidocaine 5% ointment and diclofenac from [facility]" handling RX refills for multiple pharmacy providers, ph# [phone]. It will be faxed to [phone].

## 2015-09-24 ENCOUNTER — Telehealth: Payer: Self-pay | Admitting: Family Medicine

## 2015-09-24 NOTE — Telephone Encounter (Signed)
Can give 15 pills but she will need f/u if no better

## 2015-09-24 NOTE — Telephone Encounter (Signed)
°  Relation to QJ:[id number] Call back number:[phone] Pharmacy: [facility] - [place], [place] - [address] AT [address] [phone]) [phone])         Reason for call:  Patient was seen in urgent care (not [facility]) Saturday 09/20/15 for vertigo patient states they prescribed meclizine and would like PCP to refill. Offered patient an appointment and she stated due to transportation she would not be able to come  In for an appointment. Patient has an appointment for 10/02/2015. Please advise

## 2015-09-24 NOTE — Telephone Encounter (Signed)
Order received for Lidocaine 5% pain ointment and diclofenac sodium solution 1.5%. I do not see in pt's chart that she takes either medication. Please advise. Form is in Dr. Ivy Lynn' red folder. JG//CMA

## 2015-09-24 NOTE — Telephone Encounter (Signed)
Ok to refill 

## 2015-09-24 NOTE — Telephone Encounter (Signed)
Please advise      KP 

## 2015-09-25 ENCOUNTER — Ambulatory Visit: Payer: PRIVATE HEALTH INSURANCE | Admitting: Family Medicine

## 2015-09-25 MED ORDER — MECLIZINE HCL 25 MG PO TABS: 25.0000 mg | ORAL_TABLET | Freq: Three times a day (TID) | ORAL | Status: DC | PRN

## 2015-09-25 NOTE — Telephone Encounter (Signed)
Rx faxed, patient has a pending apt on 10/02/15.      KP

## 2015-09-25 NOTE — Telephone Encounter (Signed)
done

## 2015-09-25 NOTE — Telephone Encounter (Signed)
The form is on your red folder.     KP

## 2015-10-02 ENCOUNTER — Ambulatory Visit (INDEPENDENT_AMBULATORY_CARE_PROVIDER_SITE_OTHER): Payer: Medicare Other | Admitting: Family Medicine

## 2015-10-02 ENCOUNTER — Encounter: Payer: Self-pay | Admitting: Family Medicine

## 2015-10-02 VITALS — BP 108/62 | HR 65 | Temp 97.7°F | Ht 62.0 in | Wt 141.4 lb

## 2015-10-02 DIAGNOSIS — F32A Depression, unspecified: Secondary | ICD-10-CM

## 2015-10-02 DIAGNOSIS — M150 Primary generalized (osteo)arthritis: Secondary | ICD-10-CM

## 2015-10-02 DIAGNOSIS — M159 Polyosteoarthritis, unspecified: Secondary | ICD-10-CM

## 2015-10-02 DIAGNOSIS — H8110 Benign paroxysmal vertigo, unspecified ear: Secondary | ICD-10-CM

## 2015-10-02 DIAGNOSIS — F329 Major depressive disorder, single episode, unspecified: Secondary | ICD-10-CM | POA: Diagnosis not present

## 2015-10-02 DIAGNOSIS — E1165 Type 2 diabetes mellitus with hyperglycemia: Secondary | ICD-10-CM | POA: Diagnosis not present

## 2015-10-02 DIAGNOSIS — E785 Hyperlipidemia, unspecified: Secondary | ICD-10-CM

## 2015-10-02 DIAGNOSIS — Z23 Encounter for immunization: Secondary | ICD-10-CM

## 2015-10-02 DIAGNOSIS — E1151 Type 2 diabetes mellitus with diabetic peripheral angiopathy without gangrene: Secondary | ICD-10-CM | POA: Diagnosis not present

## 2015-10-02 DIAGNOSIS — G4700 Insomnia, unspecified: Secondary | ICD-10-CM

## 2015-10-02 DIAGNOSIS — IMO0002 Reserved for concepts with insufficient information to code with codable children: Secondary | ICD-10-CM

## 2015-10-02 MED ORDER — PNEUMOCOCCAL VAC POLYVALENT 25 MCG/0.5ML IJ INJ
0.5000 mL | INJECTION | Freq: Once | INTRAMUSCULAR | Status: AC
  Administered 2015-10-02: 0.5 mL via INTRAMUSCULAR

## 2015-10-02 MED ORDER — DULOXETINE HCL 30 MG PO CPEP: 30.0000 mg | ORAL_CAPSULE | Freq: Every day | ORAL | Status: AC

## 2015-10-02 MED ORDER — MECLIZINE HCL 25 MG PO TABS: 25.0000 mg | ORAL_TABLET | Freq: Three times a day (TID) | ORAL | Status: DC | PRN

## 2015-10-02 MED ORDER — MELOXICAM 15 MG PO TABS: 15.0000 mg | ORAL_TABLET | Freq: Every day | ORAL | Status: AC

## 2015-10-02 MED ORDER — TRAZODONE HCL 50 MG PO TABS: 100.0000 mg | ORAL_TABLET | Freq: Every day | ORAL | Status: AC

## 2015-10-02 NOTE — Progress Notes (Signed)
Pre visit review using our clinic review tool, if applicable. No additional management support is needed unless otherwise documented below in the visit note. 

## 2015-10-02 NOTE — Patient Instructions (Signed)

## 2015-10-02 NOTE — Progress Notes (Signed)
Patient ID: Janice Norman, female    DOB: 19-Mar-1933  Age: 81 y.o. MRN: [id number]    Subjective:  Subjective HPI Janice Norman presents for f/u dm, cholesterol and bp.  She also needs to repeat her bun/cr.  She has been trying to drink more water.  She is still depressed about her daughters death--- it been almost 1 year.  She does not want to see a counselor or change her meds.    Review of Systems  Constitutional: Negative for diaphoresis, appetite change, fatigue and unexpected weight change.  Eyes: Negative for pain, redness and visual disturbance.  Respiratory: Negative for cough, chest tightness, shortness of breath and wheezing.   Cardiovascular: Negative for chest pain, palpitations and leg swelling.  Endocrine: Negative for cold intolerance, heat intolerance, polydipsia, polyphagia and polyuria.  Genitourinary: Negative for dysuria, frequency and difficulty urinating.  Neurological: Negative for dizziness, light-headedness, numbness and headaches.    History Past Medical History  Diagnosis Date  . Glaucoma   . Hypertension   . Diabetes mellitus without complication ([place])   . Endometrial cancer ([place]) 05/19/2009    TAHysterectomy  . Uterine cancer ([place])   . Adenomatous polyp of colon 2014  . Diverticulosis   . Anemia   . Arthritis   . IBS (irritable bowel syndrome)   . HLD (hyperlipidemia)     She has past surgical history that includes Abdominal hysterectomy (2010); Tumor removal (Left, 1980); and Tonsillectomy.   Her family history includes Brain cancer in her father; Diabetes in her father and sister; Heart disease in her father; Hypertension in her father and sister; Stomach cancer in her mother. There is no history of Colon cancer.She reports that she has never smoked. She has never used smokeless tobacco. She reports that she drinks alcohol. She reports that she does not use illicit drugs.  Current Outpatient Prescriptions on File Prior to Visit  Medication  Sig Dispense Refill  . enalapril (VASOTEC) 20 MG tablet TAKE ONE (1) TABLET EACH DAY 90 tablet 1  . gabapentin (NEURONTIN) 300 MG capsule Take 1 capsule by mouth at bedtime.    . hydrochlorothiazide (HYDRODIURIL) 50 MG tablet Take 1 tablet (50 mg total) by mouth daily. 90 tablet 3  . metFORMIN (GLUCOPHAGE-XR) 500 MG 24 hr tablet TAKE TWO TABLETS BY MOUTH EVERY NIGHT AT BEDTIME 180 tablet 1  . pantoprazole (PROTONIX) 40 MG tablet TAKE ONE (1) TABLET EACH DAY 90 tablet 3  . pioglitazone (ACTOS) 45 MG tablet TAKE ONE (1) TABLET BY MOUTH EVERY DAY 90 tablet 1  . simvastatin (ZOCOR) 40 MG tablet TAKE 1 TABLET BY MOUTH DAILY 90 tablet 1  . traMADol (ULTRAM) 50 MG tablet   2  . TRAVATAN Z 0.004 % SOLN ophthalmic solution Place 1 drop into both eyes at bedtime.     No current facility-administered medications on file prior to visit.     Objective:  Objective Physical Exam  Constitutional: She is oriented to person, place, and time. She appears well-developed and well-nourished.  HENT:  Head: Normocephalic and atraumatic.  Eyes: Conjunctivae and EOM are normal.  Neck: Normal range of motion. Neck supple. No JVD present. Carotid bruit is not present. No thyromegaly present.  Cardiovascular: Normal rate, regular rhythm and normal heart sounds.   No murmur heard. Pulmonary/Chest: Effort normal and breath sounds normal. No respiratory distress. She has no wheezes. She has no rales. She exhibits no tenderness.  Musculoskeletal: She exhibits no edema.  Neurological: She is alert and  oriented to person, place, and time.  Psychiatric: She has a normal mood and affect.  Nursing note and vitals reviewed. Sensory exam of the foot is normal, tested with the monofilament. Good pulses, no lesions or ulcers, good peripheral pulses.  BP 108/62 mmHg  Pulse 65  Temp(Src) 97.7 F (36.5 C) (Oral)  Ht 5' 2"  (1.575 m)  Wt 141 lb 6.4 oz (64.139 kg)  BMI 25.86 kg/m2  SpO2 98% Wt Readings from Last 3  Encounters:  10/02/15 141 lb 6.4 oz (64.139 kg)  08/07/15 136 lb 6.4 oz (61.871 kg)  05/02/15 131 lb (59.421 kg)     Lab Results  Component Value Date   WBC 8.6 08/07/2015   HGB 11.9* 08/07/2015   HCT 35.5* 08/07/2015   PLT 330.0 08/07/2015   GLUCOSE 129* 08/07/2015   CHOL 158 05/02/2015   TRIG 169* 05/02/2015   HDL 39* 05/02/2015   LDLCALC 85 05/02/2015   ALT 10 08/07/2015   AST 16 08/07/2015   NA 139 08/07/2015   K 3.9 08/07/2015   CL 101 08/07/2015   CREATININE 1.67* 08/07/2015   BUN 43* 08/07/2015   CO2 28 08/07/2015   HGBA1C 6.5* 05/02/2015   MICROALBUR 6.8* 12/24/2014    Mr Lumbar Spine Wo Contrast  12/31/2014  CLINICAL DATA:  Low back pain for 2 years, worsening since December of 2015. Bilateral leg weakness. Sciatica. EXAM: MRI LUMBAR SPINE WITHOUT CONTRAST TECHNIQUE: Multiplanar, multisequence MR imaging of the lumbar spine was performed. No intravenous contrast was administered. COMPARISON:  None. FINDINGS: The lowest lumbar type non-rib-bearing vertebra is labeled as L5. The conus medullaris appears normal. Conus level: L1. Disc desiccation noted at all levels between L1 and L5. Acute or subacute 40% compression fracture of the L5 vertebra with extensive vertebral body edema tracking back into the pedicles. Prominent posterior paraspinal muscular atrophy. Additional findings at individual levels are as follows: L1-2: Mild displacement of the right L1 nerve in the lateral extraforaminal space due to disc bulge. L2-3: No impingement. Diffuse disc bulge with left foraminal and lateral extraforaminal disc protrusion. Bilateral facet arthropathy, left greater than right. L3-4: Mild bilateral subarticular lateral recess stenosis with borderline bilateral foraminal stenosis due to facet arthropathy and diffuse disc bulge. L4-5: Moderate left and mild right subarticular lateral recess stenosis with mild central narrowing of the thecal sac and borderline bilateral foraminal stenosis  due to disc bulge, facet arthropathy, and a focal cephalad extending extradural process likely representing disc extrusion on image 4 of series 5, less likely to be a small epidural collection of blood products related to the L5 compression fracture. L5-S1: Borderline bilateral foraminal stenosis due to disc bulge and facet arthropathy. IMPRESSION: 1. Acute or subacute 40% superior compression fracture at L5, most likely benign. 2. Lumbar spondylosis and degenerative disc disease cause moderate impingement at L4-5 and mild impingement at L1- 2 and L3-4, as detailed above. Of note, there is a disc extrusion or small epidural collection of blood products extending cephalad from the L4-5 level in the left paracentral position, contributing to the subarticular lateral recess stenosis at L4-5. 3. Prominent posterior paraspinal muscular atrophy throughout the lumbar spine. Electronically Signed   By: Sherryl Barters M.D.   On: 12/31/2014 15:33     Assessment & Plan:  Plan I have changed Ms. Blasi Searcy's DULoxetine and meloxicam. I am also having her maintain her gabapentin, TRAVATAN Z, traMADol, metFORMIN, enalapril, pioglitazone, pantoprazole, hydrochlorothiazide, simvastatin, meclizine, and traZODone. We will continue to administer pneumococcal 23  valent vaccine.  Meds ordered this encounter  Medications  . meclizine (ANTIVERT) 25 MG tablet    Sig: Take 1 tablet (25 mg total) by mouth 3 (three) times daily as needed.    Dispense:  30 tablet    Refill:  0  . DULoxetine (CYMBALTA) 30 MG capsule    Sig: Take 1 capsule (30 mg total) by mouth at bedtime.    Dispense:  90 capsule    Refill:  3  . traZODone (DESYREL) 50 MG tablet    Sig: Take 2 tablets (100 mg total) by mouth at bedtime.    Dispense:  180 tablet    Refill:  3  . meloxicam (MOBIC) 15 MG tablet    Sig: Take 1 tablet (15 mg total) by mouth daily.    Dispense:  90 tablet    Refill:  1  . pneumococcal 23 valent vaccine (PNU-IMMUNE)  injection 0.5 mL    Sig:     Problem List Items Addressed This Visit    Depression   Relevant Medications   DULoxetine (CYMBALTA) 30 MG capsule   traZODone (DESYREL) 50 MG tablet    Other Visit Diagnoses    Insomnia    -  Primary    Relevant Medications    traZODone (DESYREL) 50 MG tablet    DM (diabetes mellitus) type II uncontrolled, periph vascular disorder (HCC)        Relevant Orders    CBC with Differential/Platelet    Hemoglobin A1c    Benign paroxysmal positional vertigo, unspecified laterality        Relevant Medications    meclizine (ANTIVERT) 25 MG tablet    Primary osteoarthritis involving multiple joints        Relevant Medications    meloxicam (MOBIC) 15 MG tablet    Hyperlipidemia LDL goal <70        Relevant Orders    Lipid panel    CBC with Differential/Platelet    Hemoglobin A1c    Comp Met (CMET)    Hyperlipidemia        Need for prophylactic vaccination against Streptococcus pneumoniae (pneumococcus)        Relevant Medications    pneumococcal 23 valent vaccine (PNU-IMMUNE) injection 0.5 mL       Follow-up: Return in about 6 months (around 04/01/2016), or if symptoms worsen or fail to improve, for annual exam, fasting.  Garnet Koyanagi, DO     +

## 2015-10-03 LAB — COMPREHENSIVE METABOLIC PANEL
ALBUMIN: 3.9 g/dL (ref 3.5–5.2)
ALK PHOS: 47 U/L (ref 39–117)
ALT: 9 U/L (ref 0–35)
AST: 18 U/L (ref 0–37)
BILIRUBIN TOTAL: 0.4 mg/dL (ref 0.2–1.2)
BUN: 31 mg/dL — ABNORMAL HIGH (ref 6–23)
CALCIUM: 9.8 mg/dL (ref 8.4–10.5)
CO2: 29 mEq/L (ref 19–32)
CREATININE: 0.99 mg/dL (ref 0.40–1.20)
Chloride: 104 mEq/L (ref 96–112)
GFR: 57.34 mL/min — AB (ref >60.00)
Glucose, Bld: 105 mg/dL — ABNORMAL HIGH (ref 70–99)
Potassium: 4.4 mEq/L (ref 3.5–5.1)
Sodium: 139 mEq/L (ref 135–145)
TOTAL PROTEIN: 7.1 g/dL (ref 6.0–8.3)

## 2015-10-03 LAB — LIPID PANEL
CHOL/HDL RATIO: 4
Cholesterol: 159 mg/dL (ref 0–200)
HDL: 44.2 mg/dL (ref >39.00)
LDL CALC: 79 mg/dL (ref 0–99)
NONHDL: 114.92
Triglycerides: 181 mg/dL — ABNORMAL HIGH (ref 0.0–149.0)
VLDL: 36.2 mg/dL (ref 0.0–40.0)

## 2015-10-03 LAB — CBC WITH DIFFERENTIAL/PLATELET
Basophils Absolute: 0 10*3/uL (ref 0.0–0.1)
Basophils Relative: 0.5 % (ref 0.0–3.0)
EOS PCT: 3.1 % (ref 0.0–5.0)
Eosinophils Absolute: 0.2 10*3/uL (ref 0.0–0.7)
HEMATOCRIT: 33.4 % — AB (ref 36.0–46.0)
HEMOGLOBIN: 11 g/dL — AB (ref 12.0–15.0)
Lymphocytes Relative: 21.5 % (ref 12.0–46.0)
Lymphs Abs: 1.2 10*3/uL (ref 0.7–4.0)
MCHC: 32.8 g/dL (ref 30.0–36.0)
MCV: 91.8 fl (ref 78.0–100.0)
Monocytes Absolute: 0.4 10*3/uL (ref 0.1–1.0)
Monocytes Relative: 6.7 % (ref 3.0–12.0)
NEUTROS PCT: 68.2 % (ref 43.0–77.0)
Neutro Abs: 3.8 10*3/uL (ref 1.4–7.7)
Platelets: 334 10*3/uL (ref 150.0–400.0)
RBC: 3.64 Mil/uL — AB (ref 3.87–5.11)
RDW: 14.8 % (ref 11.5–15.5)
WBC: 5.6 10*3/uL (ref 4.0–10.5)

## 2015-10-03 LAB — HEMOGLOBIN A1C: Hgb A1c MFr Bld: 6.3 % (ref 4.6–6.5)

## 2015-10-04 ENCOUNTER — Other Ambulatory Visit: Payer: Self-pay | Admitting: Family Medicine

## 2015-10-08 ENCOUNTER — Other Ambulatory Visit: Payer: Self-pay

## 2015-10-08 MED ORDER — ATORVASTATIN CALCIUM 20 MG PO TABS: 20.0000 mg | ORAL_TABLET | Freq: Every day | ORAL | Status: DC

## 2015-10-17 ENCOUNTER — Other Ambulatory Visit: Payer: Self-pay | Admitting: Family Medicine

## 2015-10-28 ENCOUNTER — Other Ambulatory Visit: Payer: Self-pay | Admitting: Family Medicine

## 2015-11-07 ENCOUNTER — Other Ambulatory Visit: Payer: Self-pay | Admitting: Family Medicine

## 2015-11-15 ENCOUNTER — Other Ambulatory Visit: Payer: Self-pay | Admitting: Family Medicine

## 2015-11-19 ENCOUNTER — Telehealth: Payer: Self-pay | Admitting: Family Medicine

## 2015-11-19 NOTE — Telephone Encounter (Signed)
error:[id number] ° °

## 2015-11-27 ENCOUNTER — Telehealth: Payer: Self-pay | Admitting: Family Medicine

## 2015-11-27 NOTE — Telephone Encounter (Signed)
Caller name: May with [employer] No phone or fax # given  Reason for call: following up on request for diabetic testing supplies previously faxed to [phone]. Advised that I do not see noted in chart. She is to refax.

## 2015-11-28 NOTE — Telephone Encounter (Signed)
Has not been received by paperwork/bin.SLS

## 2015-12-03 ENCOUNTER — Encounter: Payer: Self-pay | Admitting: *Deleted

## 2015-12-03 NOTE — Telephone Encounter (Signed)
Received form from [employer] for diabetic testing supplies; forwarded to provider/SLS

## 2015-12-04 DIAGNOSIS — M4806 Spinal stenosis, lumbar region: Secondary | ICD-10-CM | POA: Diagnosis not present

## 2015-12-04 DIAGNOSIS — M4726 Other spondylosis with radiculopathy, lumbar region: Secondary | ICD-10-CM | POA: Diagnosis not present

## 2015-12-04 NOTE — Telephone Encounter (Signed)
Patient has been notified that we received form

## 2015-12-05 ENCOUNTER — Other Ambulatory Visit: Payer: Self-pay | Admitting: Orthopedic Surgery

## 2015-12-05 DIAGNOSIS — M4726 Other spondylosis with radiculopathy, lumbar region: Secondary | ICD-10-CM

## 2015-12-12 ENCOUNTER — Telehealth: Payer: Self-pay | Admitting: Family Medicine

## 2015-12-12 DIAGNOSIS — H8110 Benign paroxysmal vertigo, unspecified ear: Secondary | ICD-10-CM

## 2015-12-12 MED ORDER — MECLIZINE HCL 25 MG PO TABS: 25.0000 mg | ORAL_TABLET | Freq: Three times a day (TID) | ORAL | Status: AC | PRN

## 2015-12-12 NOTE — Telephone Encounter (Signed)
Relation to [id number] Call back number:[phone] Pharmacy: [facility] - [place], [place] - [address] AT [address] [phone] (Phone) [phone] (Fax)         Reason for call:  Patient requesting a refill of her vertigo medication (does not know the name) but stated MD has prescribed and assessed her before regarding vertigo

## 2015-12-12 NOTE — Telephone Encounter (Signed)
Meclizine --- ok to refill

## 2015-12-12 NOTE — Telephone Encounter (Signed)
Please advise      KP 

## 2015-12-12 NOTE — Telephone Encounter (Signed)
Rx faxed.    KP 

## 2015-12-18 ENCOUNTER — Ambulatory Visit
Admission: RE | Admit: 2015-12-18 | Discharge: 2015-12-18 | Disposition: A | Discharge status: Home / Self Care | Payer: Medicare Other | Source: Ambulatory Visit | Attending: Orthopedic Surgery | Admitting: Orthopedic Surgery

## 2015-12-18 DIAGNOSIS — M4726 Other spondylosis with radiculopathy, lumbar region: Secondary | ICD-10-CM

## 2015-12-18 DIAGNOSIS — M5126 Other intervertebral disc displacement, lumbar region: Secondary | ICD-10-CM | POA: Diagnosis not present

## 2015-12-25 DIAGNOSIS — M4726 Other spondylosis with radiculopathy, lumbar region: Secondary | ICD-10-CM | POA: Diagnosis not present

## 2015-12-25 DIAGNOSIS — M8008XD Age-related osteoporosis with current pathological fracture, vertebra(e), subsequent encounter for fracture with routine healing: Secondary | ICD-10-CM | POA: Diagnosis not present

## 2015-12-25 DIAGNOSIS — M5431 Sciatica, right side: Secondary | ICD-10-CM | POA: Diagnosis not present

## 2015-12-25 DIAGNOSIS — M4806 Spinal stenosis, lumbar region: Secondary | ICD-10-CM | POA: Diagnosis not present

## 2015-12-26 ENCOUNTER — Other Ambulatory Visit: Payer: Self-pay | Admitting: Orthopedic Surgery

## 2015-12-30 ENCOUNTER — Other Ambulatory Visit: Payer: Self-pay

## 2015-12-30 ENCOUNTER — Other Ambulatory Visit: Payer: Self-pay | Admitting: Orthopedic Surgery

## 2015-12-30 DIAGNOSIS — M8008XD Age-related osteoporosis with current pathological fracture, vertebra(e), subsequent encounter for fracture with routine healing: Secondary | ICD-10-CM

## 2016-01-02 ENCOUNTER — Other Ambulatory Visit: Payer: Self-pay

## 2016-01-02 MED ORDER — ATORVASTATIN CALCIUM 20 MG PO TABS
20.0000 mg | ORAL_TABLET | Freq: Every day | ORAL | Status: DC
Start: 2016-01-02 — End: 2016-04-19

## 2016-01-27 ENCOUNTER — Ambulatory Visit
Admission: RE | Admit: 2016-01-27 | Discharge: 2016-01-27 | Disposition: A | Discharge status: Home / Self Care | Payer: Medicare Other | Source: Ambulatory Visit | Attending: Orthopedic Surgery | Admitting: Orthopedic Surgery

## 2016-01-27 DIAGNOSIS — Z780 Asymptomatic menopausal state: Secondary | ICD-10-CM | POA: Diagnosis not present

## 2016-01-27 DIAGNOSIS — M8008XD Age-related osteoporosis with current pathological fracture, vertebra(e), subsequent encounter for fracture with routine healing: Secondary | ICD-10-CM

## 2016-01-30 DIAGNOSIS — M5126 Other intervertebral disc displacement, lumbar region: Secondary | ICD-10-CM | POA: Diagnosis not present

## 2016-01-30 DIAGNOSIS — M545 Low back pain: Secondary | ICD-10-CM | POA: Diagnosis not present

## 2016-01-30 DIAGNOSIS — M4806 Spinal stenosis, lumbar region: Secondary | ICD-10-CM | POA: Diagnosis not present

## 2016-02-16 ENCOUNTER — Other Ambulatory Visit: Payer: Self-pay | Admitting: Family Medicine

## 2016-02-16 DIAGNOSIS — E785 Hyperlipidemia, unspecified: Secondary | ICD-10-CM

## 2016-02-16 DIAGNOSIS — E119 Type 2 diabetes mellitus without complications: Secondary | ICD-10-CM

## 2016-02-16 NOTE — Telephone Encounter (Signed)
Please schedule this patient a lab apt. The orders are in.     [place]

## 2016-02-16 NOTE — Telephone Encounter (Signed)
Refill x1 

## 2016-02-17 NOTE — Telephone Encounter (Signed)
Notified patient that she needs to come in for labs. Patient will call back once she checks with her transportation to schedule.

## 2016-02-19 ENCOUNTER — Other Ambulatory Visit (INDEPENDENT_AMBULATORY_CARE_PROVIDER_SITE_OTHER): Payer: Medicare Other

## 2016-02-19 DIAGNOSIS — E785 Hyperlipidemia, unspecified: Secondary | ICD-10-CM

## 2016-02-19 DIAGNOSIS — R8299 Other abnormal findings in urine: Secondary | ICD-10-CM

## 2016-02-19 DIAGNOSIS — R82998 Other abnormal findings in urine: Secondary | ICD-10-CM

## 2016-02-19 DIAGNOSIS — E119 Type 2 diabetes mellitus without complications: Secondary | ICD-10-CM | POA: Diagnosis not present

## 2016-02-19 LAB — POC URINALSYSI DIPSTICK (AUTOMATED)
Bilirubin, UA: NEGATIVE
Glucose, UA: NEGATIVE
KETONES UA: NEGATIVE
Nitrite, UA: NEGATIVE
PH UA: 6
PROTEIN UA: NEGATIVE
RBC UA: NEGATIVE
SPEC GRAV UA: 1.025
Urobilinogen, UA: 0.2

## 2016-02-19 LAB — LIPID PANEL
CHOLESTEROL: 188 mg/dL (ref 0–200)
HDL: 47.8 mg/dL (ref >39.00)
LDL Cholesterol: 118 mg/dL — ABNORMAL HIGH (ref 0–99)
NonHDL: 140.23
TRIGLYCERIDES: 110 mg/dL (ref 0.0–149.0)
Total CHOL/HDL Ratio: 4
VLDL: 22 mg/dL (ref 0.0–40.0)

## 2016-02-19 LAB — COMPREHENSIVE METABOLIC PANEL
ALBUMIN: 4.5 g/dL (ref 3.5–5.2)
ALK PHOS: 55 U/L (ref 39–117)
ALT: 10 U/L (ref 0–35)
AST: 17 U/L (ref 0–37)
BILIRUBIN TOTAL: 0.8 mg/dL (ref 0.2–1.2)
BUN: 27 mg/dL — ABNORMAL HIGH (ref 6–23)
CALCIUM: 10 mg/dL (ref 8.4–10.5)
CO2: 30 mEq/L (ref 19–32)
CREATININE: 0.96 mg/dL (ref 0.40–1.20)
Chloride: 101 mEq/L (ref 96–112)
GFR: 59.36 mL/min — ABNORMAL LOW (ref >60.00)
Glucose, Bld: 127 mg/dL — ABNORMAL HIGH (ref 70–99)
Potassium: 3.7 mEq/L (ref 3.5–5.1)
Sodium: 139 mEq/L (ref 135–145)
Total Protein: 8 g/dL (ref 6.0–8.3)

## 2016-02-19 LAB — HEMOGLOBIN A1C: HEMOGLOBIN A1C: 6.4 % (ref 4.6–6.5)

## 2016-02-20 LAB — URINE CULTURE: Colony Count: 4000

## 2016-02-26 ENCOUNTER — Telehealth: Payer: Self-pay | Admitting: Family Medicine

## 2016-02-26 NOTE — Telephone Encounter (Signed)
Patient aware the results are not back yet and I will call once they have been resulted.    KP

## 2016-02-26 NOTE — Telephone Encounter (Signed)
Caller name: Self  Can be reached: [phone]   Reason for call: Request a call back with lab results from 3/13. States she can not get on My Chart

## 2016-03-02 DIAGNOSIS — M5126 Other intervertebral disc displacement, lumbar region: Secondary | ICD-10-CM | POA: Diagnosis not present

## 2016-03-23 DIAGNOSIS — M545 Low back pain: Secondary | ICD-10-CM | POA: Diagnosis not present

## 2016-03-23 DIAGNOSIS — M47816 Spondylosis without myelopathy or radiculopathy, lumbar region: Secondary | ICD-10-CM | POA: Diagnosis not present

## 2016-03-23 DIAGNOSIS — M5126 Other intervertebral disc displacement, lumbar region: Secondary | ICD-10-CM | POA: Diagnosis not present

## 2016-04-19 ENCOUNTER — Other Ambulatory Visit: Payer: Self-pay | Admitting: Family Medicine

## 2016-04-19 NOTE — Telephone Encounter (Signed)
90 day supply atorvstatin sent to [facility]. Pt past due for f/u with PCP and mychart message has been sent to pt re: need for appt.

## 2016-04-30 DIAGNOSIS — M158 Other polyosteoarthritis: Secondary | ICD-10-CM | POA: Diagnosis not present

## 2016-04-30 DIAGNOSIS — E119 Type 2 diabetes mellitus without complications: Secondary | ICD-10-CM | POA: Diagnosis not present

## 2016-04-30 DIAGNOSIS — Z6827 Body mass index (BMI) 27.0-27.9, adult: Secondary | ICD-10-CM | POA: Diagnosis not present

## 2016-04-30 DIAGNOSIS — I10 Essential (primary) hypertension: Secondary | ICD-10-CM | POA: Diagnosis not present

## 2016-04-30 DIAGNOSIS — E782 Mixed hyperlipidemia: Secondary | ICD-10-CM | POA: Diagnosis not present

## 2016-05-05 DIAGNOSIS — E119 Type 2 diabetes mellitus without complications: Secondary | ICD-10-CM | POA: Diagnosis not present

## 2016-05-05 DIAGNOSIS — I10 Essential (primary) hypertension: Secondary | ICD-10-CM | POA: Diagnosis not present

## 2016-05-05 DIAGNOSIS — T1491 Suicide attempt: Secondary | ICD-10-CM | POA: Diagnosis not present

## 2016-05-05 DIAGNOSIS — Z6826 Body mass index (BMI) 26.0-26.9, adult: Secondary | ICD-10-CM | POA: Diagnosis not present

## 2016-05-11 DIAGNOSIS — E782 Mixed hyperlipidemia: Secondary | ICD-10-CM | POA: Diagnosis not present

## 2016-05-11 DIAGNOSIS — E119 Type 2 diabetes mellitus without complications: Secondary | ICD-10-CM | POA: Diagnosis not present

## 2016-05-11 DIAGNOSIS — I10 Essential (primary) hypertension: Secondary | ICD-10-CM | POA: Diagnosis not present

## 2016-06-07 ENCOUNTER — Other Ambulatory Visit: Payer: Self-pay | Admitting: Family Medicine

## 2016-06-21 ENCOUNTER — Other Ambulatory Visit: Payer: Self-pay | Admitting: Family Medicine

## 2016-06-21 NOTE — Telephone Encounter (Signed)
Rx sent to the pharmacy by e-script.  However only given #60.  The patient needs further evaluation and/or laboratory testing before further refills are given.  Ask patient to make an appointment for this.//AB/CMA

## 2016-06-24 ENCOUNTER — Other Ambulatory Visit: Payer: Self-pay | Admitting: Family Medicine

## 2016-07-30 ENCOUNTER — Other Ambulatory Visit: Payer: Self-pay | Admitting: Family Medicine

## 2016-08-05 ENCOUNTER — Other Ambulatory Visit: Payer: Self-pay | Admitting: Family Medicine

## 2016-08-12 ENCOUNTER — Telehealth: Payer: Self-pay

## 2016-08-12 NOTE — Telephone Encounter (Signed)
Spoke with Janice Norman she has moved to [place] and would like for her medical records sent to her. She stated that she called the office in May and asked for records. Janice Norman needs them sent to her new address: [address] [place], [address]. Please advise----PC

## 2016-08-12 NOTE — Telephone Encounter (Signed)
Ok to send-- if med release signed

## 2016-08-25 DIAGNOSIS — E119 Type 2 diabetes mellitus without complications: Secondary | ICD-10-CM | POA: Diagnosis not present

## 2016-08-25 DIAGNOSIS — I10 Essential (primary) hypertension: Secondary | ICD-10-CM | POA: Diagnosis not present

## 2016-08-25 DIAGNOSIS — E784 Other hyperlipidemia: Secondary | ICD-10-CM | POA: Diagnosis not present

## 2016-08-25 DIAGNOSIS — Z13820 Encounter for screening for osteoporosis: Secondary | ICD-10-CM | POA: Diagnosis not present

## 2016-08-31 NOTE — Telephone Encounter (Signed)
Open in error

## 2016-09-10 ENCOUNTER — Other Ambulatory Visit: Payer: Self-pay | Admitting: Family Medicine

## 2016-09-16 ENCOUNTER — Other Ambulatory Visit: Payer: Self-pay | Admitting: Family Medicine

## 2016-10-16 ENCOUNTER — Other Ambulatory Visit: Payer: Self-pay | Admitting: Family Medicine

## 2016-10-18 NOTE — Telephone Encounter (Signed)
Pt last seen 10/01/15 and advised 6 month follow up. Pt completed labs in March but did not come in for office visit. Reported that she may be moving. Actos refill from [facility] denied with note that pt should contact Provider.

## 2016-11-17 DIAGNOSIS — E119 Type 2 diabetes mellitus without complications: Secondary | ICD-10-CM | POA: Diagnosis not present

## 2016-11-17 DIAGNOSIS — Z6828 Body mass index (BMI) 28.0-28.9, adult: Secondary | ICD-10-CM | POA: Diagnosis not present

## 2016-11-17 DIAGNOSIS — E784 Other hyperlipidemia: Secondary | ICD-10-CM | POA: Diagnosis not present

## 2016-11-17 DIAGNOSIS — I10 Essential (primary) hypertension: Secondary | ICD-10-CM | POA: Diagnosis not present

## 2016-12-02 DIAGNOSIS — E0822 Diabetes mellitus due to underlying condition with diabetic chronic kidney disease: Secondary | ICD-10-CM | POA: Diagnosis not present

## 2016-12-02 DIAGNOSIS — I10 Essential (primary) hypertension: Secondary | ICD-10-CM | POA: Diagnosis not present

## 2016-12-02 DIAGNOSIS — Z79899 Other long term (current) drug therapy: Secondary | ICD-10-CM | POA: Diagnosis not present

## 2016-12-02 DIAGNOSIS — E782 Mixed hyperlipidemia: Secondary | ICD-10-CM | POA: Diagnosis not present

## 2016-12-09 DIAGNOSIS — E784 Other hyperlipidemia: Secondary | ICD-10-CM | POA: Diagnosis not present

## 2016-12-09 DIAGNOSIS — E119 Type 2 diabetes mellitus without complications: Secondary | ICD-10-CM | POA: Diagnosis not present

## 2016-12-09 DIAGNOSIS — Z6828 Body mass index (BMI) 28.0-28.9, adult: Secondary | ICD-10-CM | POA: Diagnosis not present

## 2016-12-09 DIAGNOSIS — I10 Essential (primary) hypertension: Secondary | ICD-10-CM | POA: Diagnosis not present

## 2016-12-09 DIAGNOSIS — H6121 Impacted cerumen, right ear: Secondary | ICD-10-CM | POA: Diagnosis not present

## 2016-12-28 DIAGNOSIS — H6123 Impacted cerumen, bilateral: Secondary | ICD-10-CM | POA: Diagnosis not present

## 2016-12-28 DIAGNOSIS — H9203 Otalgia, bilateral: Secondary | ICD-10-CM | POA: Diagnosis not present

## 2017-02-16 DIAGNOSIS — H26493 Other secondary cataract, bilateral: Secondary | ICD-10-CM | POA: Diagnosis not present

## 2017-02-16 DIAGNOSIS — H401132 Primary open-angle glaucoma, bilateral, moderate stage: Secondary | ICD-10-CM | POA: Diagnosis not present

## 2017-03-19 IMAGING — MR MR LUMBAR SPINE W/O CM
4 of 5 series · 20 of 48 positions shown · non-contrast
Comparison: 12/31/2014

CLINICAL DATA: Osteoarthritis of the lumbar spine with bilateral
thigh pain.

EXAM:
MRI LUMBAR SPINE WITHOUT CONTRAST
TECHNIQUE: Multiplanar, multisequence MR imaging of the lumbar spine was
performed. No intravenous contrast was administered.

[Series 6: T2 · sagittal · 4.0mm · 0.73mm/px · 6 of 13 slices shown (1 of 2)]
[im 1/13]
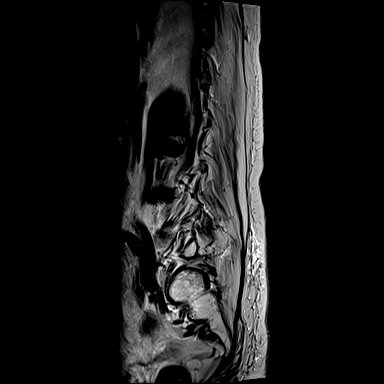
[im 3/13]
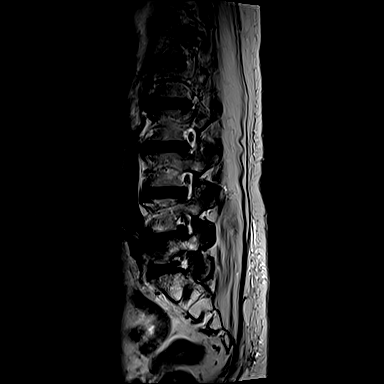
[im 5/13]
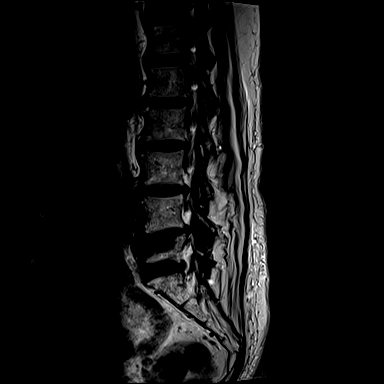
[im 8/13]
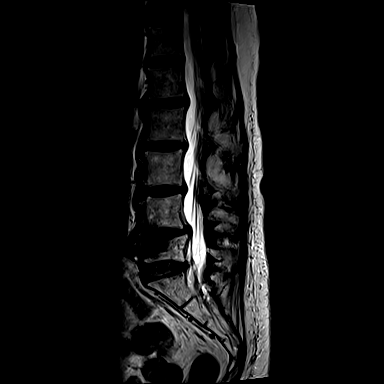
[im 10/13]
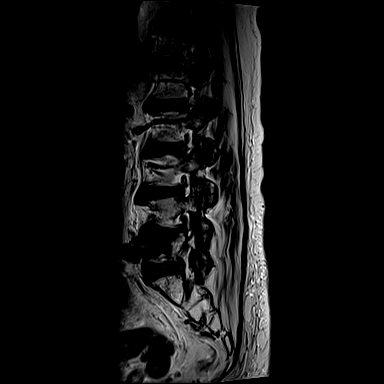
[im 13/13]
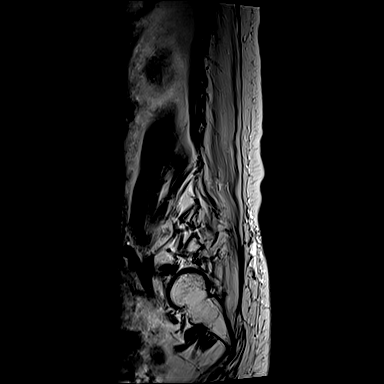

[Series 8: T1 · sagittal · 4.0mm · 0.73mm/px · 3 of 13 slices shown (1 of 2)]
[im 3/13]
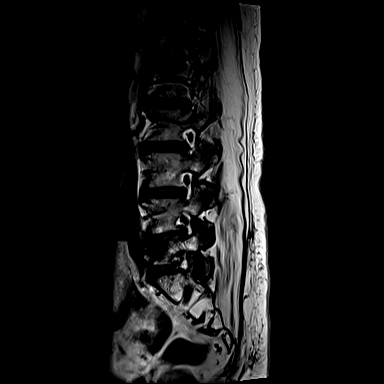
[im 8/13]
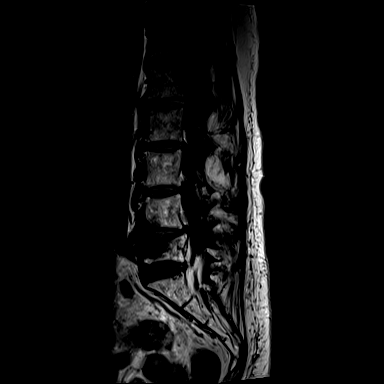
[im 13/13]
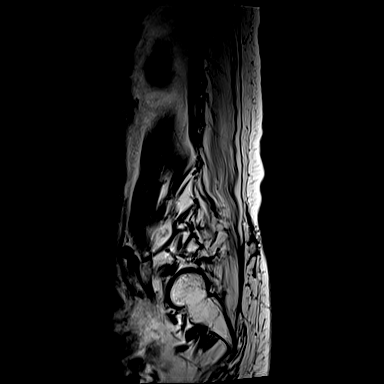

[Series 9: T1 · axial · 4.0mm · 0.56mm/px · z∈[-64,+35]mm · 3 of 30 slices shown (2 of 2)]
[im 5/30]
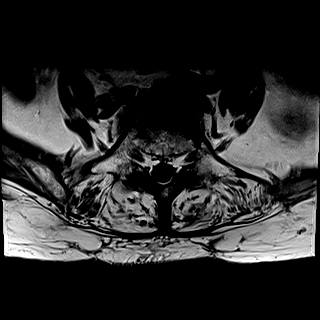
[im 15/30]
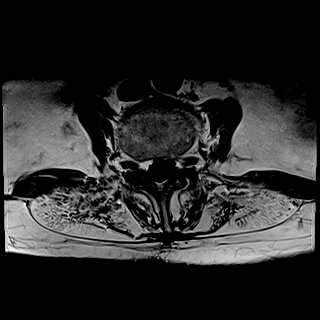
[im 25/30]
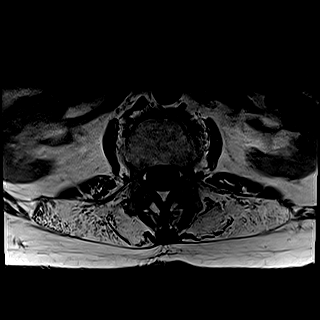

[Series 10: T2 · axial · 4.0mm · 0.28mm/px · z∈[-84,+35]mm · 8 of 30 slices shown (2 of 2)]
[im 1/30]
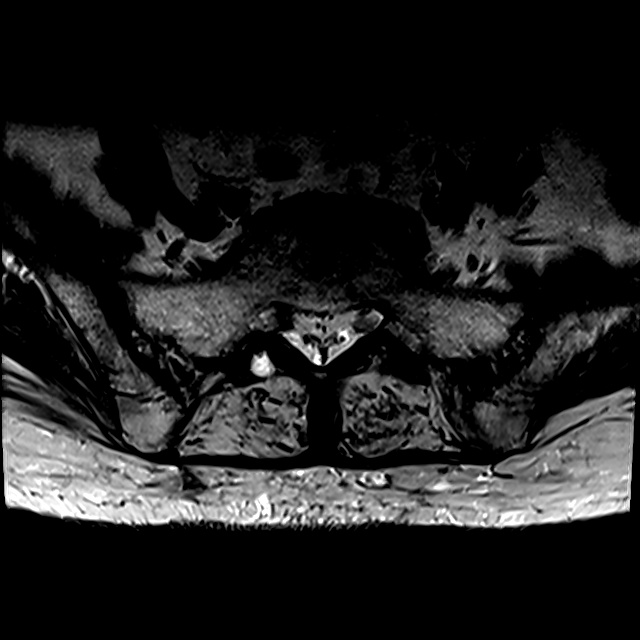
[im 5/30]
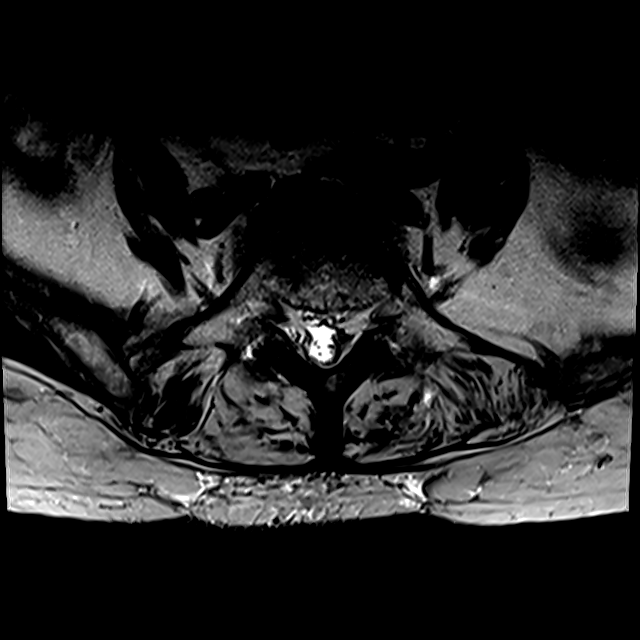
[im 9/30]
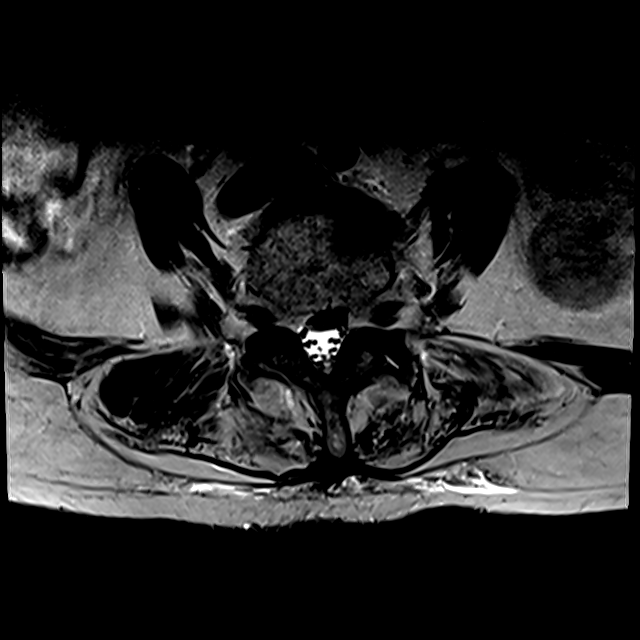
[im 13/30]
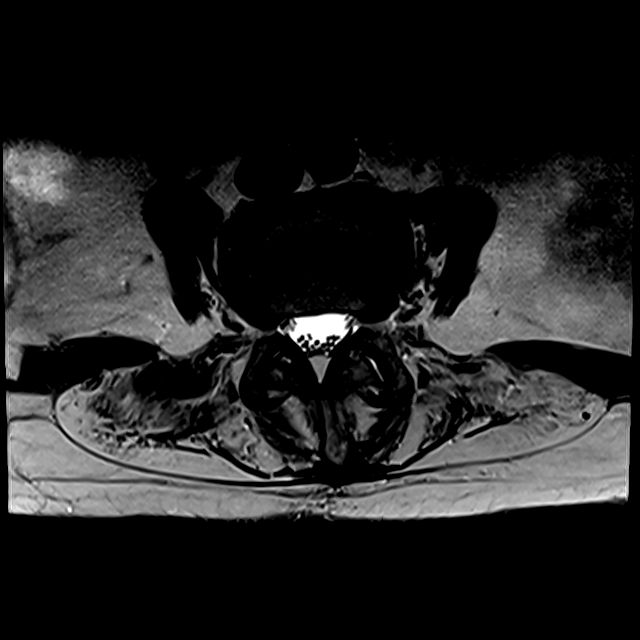
[im 15/30]
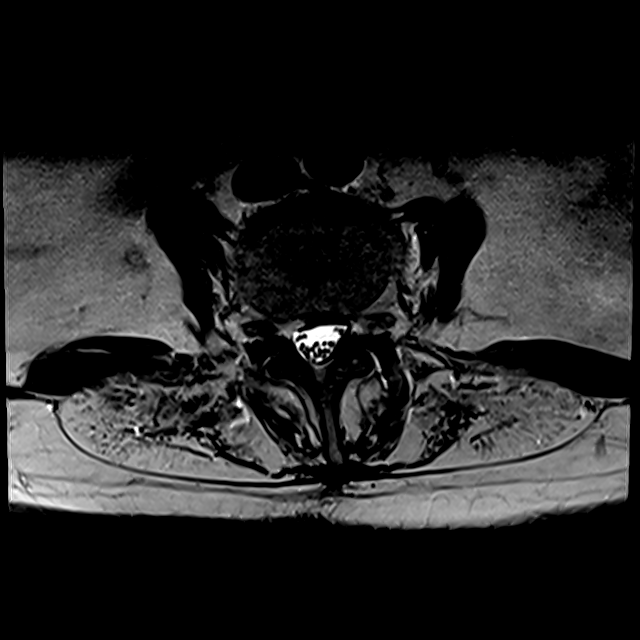
[im 17/30]
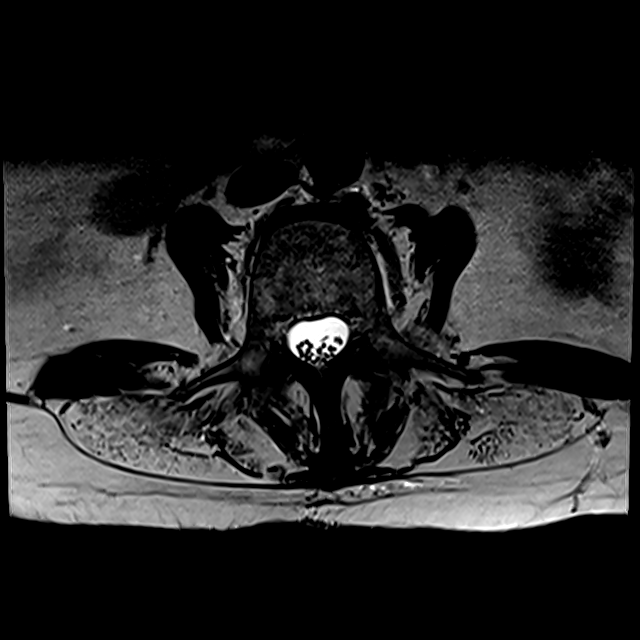
[im 21/30]
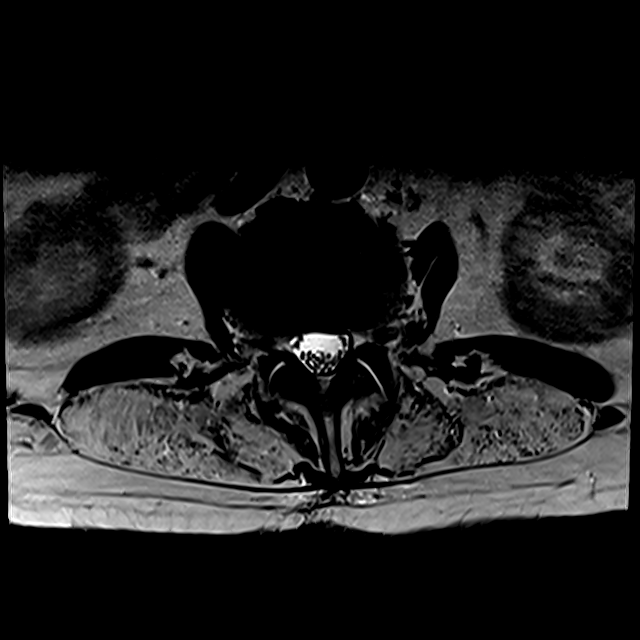
[im 25/30]
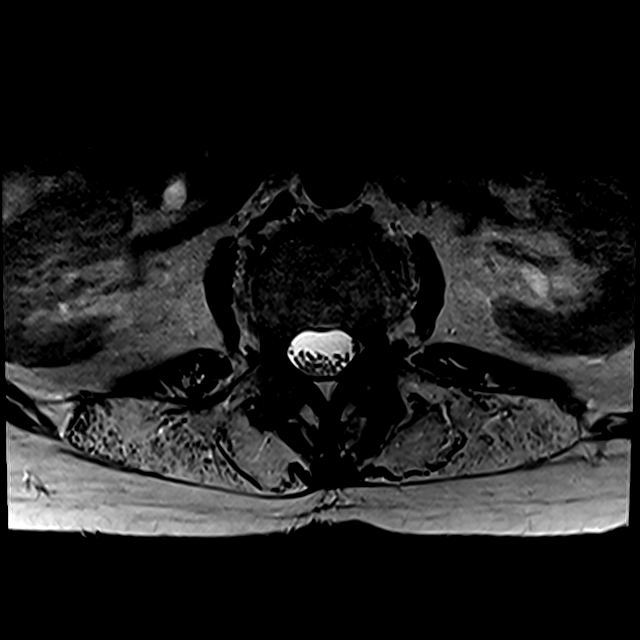

[20 of 48 positions shown; findings below may reference images not displayed]

FINDINGS: Resolved marrow edema in the previously identified L5 compression
fracture. New hypo intense signal at the L5 level which is
consistent with cement augmentation. Much of this cement appears
accumulated in the L4-5 disc space.

Advanced intrinsic back muscle atrophy.

Degenerative changes:

T12- L1:

Disc: No herniation.

Facets: Negative.

Canal: Patent.

Foramina: No impingement.

L1-L2:

Disc: Narrowing, desiccation, and bulging.

Facets: Small joint fluid without notable arthropathic change

Canal: Patent.

Foramina: No impingement.

L2-L3:

Disc: Narrowing, desiccation, and bulging.

Facets: Small joint fluid with early degenerative marginal spurring

Canal: Patent.

Foramina: No impingement.

L3-L4:

Disc: Narrowing, desiccation, and bulging

Facets: Arthropathy with moderate spurring and joint fluid.
Ligamentum flavum thickening

Canal: Ventral thecal sac flattening without impingement

Foramina: No impingement.

L4-L5:

Disc: Interval contraction and desiccation of a superiorly directed
disc extrusion. Disc bulging

Facets: Facet arthropathy with spurring and small left fluid.
Moderate ligamentum flavum thickening

Canal: Ventral thecal sac flattening without suspected impingement.
Diminished mass effect on the descending L5 nerve related to the
above described extrusion.

Foramina: Narrowing mainly from disc bulge.  No neural compression

L5-S1:

Disc: Narrowing and mild bulging

Facets: Arthropathy with spurring and bilateral effusion

Canal: Patent.

Foramina: No impingement.
IMPRESSION: 1. L5 vertebroplasty. Most of the cement is in the L4-5 disc space,
but there is completed fracture healing.
2. L4-5 superiorly directed disc extrusion has contracted and
desiccated since prior, with diminished mass effect on the
descending L5 nerve.
3. Elsewhere stable noncompressive degenerative changes which are
described above.

## 2017-03-25 DIAGNOSIS — Z6828 Body mass index (BMI) 28.0-28.9, adult: Secondary | ICD-10-CM | POA: Diagnosis not present

## 2017-03-25 DIAGNOSIS — E784 Other hyperlipidemia: Secondary | ICD-10-CM | POA: Diagnosis not present

## 2017-03-25 DIAGNOSIS — M158 Other polyosteoarthritis: Secondary | ICD-10-CM | POA: Diagnosis not present

## 2017-03-25 DIAGNOSIS — I10 Essential (primary) hypertension: Secondary | ICD-10-CM | POA: Diagnosis not present

## 2017-03-25 DIAGNOSIS — E119 Type 2 diabetes mellitus without complications: Secondary | ICD-10-CM | POA: Diagnosis not present

## 2017-04-05 DIAGNOSIS — Z79899 Other long term (current) drug therapy: Secondary | ICD-10-CM | POA: Diagnosis not present

## 2017-04-05 DIAGNOSIS — D6489 Other specified anemias: Secondary | ICD-10-CM | POA: Diagnosis not present

## 2017-04-19 DIAGNOSIS — D510 Vitamin B12 deficiency anemia due to intrinsic factor deficiency: Secondary | ICD-10-CM | POA: Diagnosis not present

## 2017-04-26 DIAGNOSIS — D510 Vitamin B12 deficiency anemia due to intrinsic factor deficiency: Secondary | ICD-10-CM | POA: Diagnosis not present

## 2017-05-04 DIAGNOSIS — D510 Vitamin B12 deficiency anemia due to intrinsic factor deficiency: Secondary | ICD-10-CM | POA: Diagnosis not present

## 2017-05-10 DIAGNOSIS — D510 Vitamin B12 deficiency anemia due to intrinsic factor deficiency: Secondary | ICD-10-CM | POA: Diagnosis not present

## 2017-05-13 DIAGNOSIS — R531 Weakness: Secondary | ICD-10-CM | POA: Diagnosis not present

## 2017-05-13 DIAGNOSIS — R5383 Other fatigue: Secondary | ICD-10-CM | POA: Diagnosis not present

## 2017-05-13 DIAGNOSIS — E559 Vitamin D deficiency, unspecified: Secondary | ICD-10-CM | POA: Diagnosis not present

## 2017-05-13 DIAGNOSIS — E119 Type 2 diabetes mellitus without complications: Secondary | ICD-10-CM | POA: Diagnosis not present

## 2017-05-13 DIAGNOSIS — I10 Essential (primary) hypertension: Secondary | ICD-10-CM | POA: Diagnosis not present

## 2017-05-13 DIAGNOSIS — E539 Vitamin B deficiency, unspecified: Secondary | ICD-10-CM | POA: Diagnosis not present

## 2017-06-02 DIAGNOSIS — E784 Other hyperlipidemia: Secondary | ICD-10-CM | POA: Diagnosis not present

## 2017-06-02 DIAGNOSIS — D518 Other vitamin B12 deficiency anemias: Secondary | ICD-10-CM | POA: Diagnosis not present

## 2017-06-02 DIAGNOSIS — N390 Urinary tract infection, site not specified: Secondary | ICD-10-CM | POA: Diagnosis not present

## 2017-06-02 DIAGNOSIS — Z6828 Body mass index (BMI) 28.0-28.9, adult: Secondary | ICD-10-CM | POA: Diagnosis not present

## 2017-06-02 DIAGNOSIS — M158 Other polyosteoarthritis: Secondary | ICD-10-CM | POA: Diagnosis not present

## 2017-06-02 DIAGNOSIS — I10 Essential (primary) hypertension: Secondary | ICD-10-CM | POA: Diagnosis not present

## 2017-06-02 DIAGNOSIS — E119 Type 2 diabetes mellitus without complications: Secondary | ICD-10-CM | POA: Diagnosis not present

## 2017-07-14 DIAGNOSIS — D649 Anemia, unspecified: Secondary | ICD-10-CM | POA: Diagnosis not present

## 2017-11-04 DIAGNOSIS — Z8542 Personal history of malignant neoplasm of other parts of uterus: Secondary | ICD-10-CM | POA: Diagnosis not present

## 2017-11-04 DIAGNOSIS — C541 Malignant neoplasm of endometrium: Secondary | ICD-10-CM | POA: Diagnosis not present

## 2017-11-04 DIAGNOSIS — Z01419 Encounter for gynecological examination (general) (routine) without abnormal findings: Secondary | ICD-10-CM | POA: Diagnosis not present

## 2017-11-07 DIAGNOSIS — Z1231 Encounter for screening mammogram for malignant neoplasm of breast: Secondary | ICD-10-CM | POA: Diagnosis not present

## 2022-06-05 DEATH — deceased
